# Patient Record
Sex: Female | Born: 1955 | Race: Black or African American | Hispanic: No | Marital: Married | Smoking: Never smoker
Health system: Southern US, Community
[De-identification: ages and names within clinical notes are randomized; demographics above are authoritative.]

## PROBLEM LIST (undated history)

## (undated) DIAGNOSIS — I10 Essential (primary) hypertension: Secondary | ICD-10-CM

## (undated) DIAGNOSIS — F329 Major depressive disorder, single episode, unspecified: Secondary | ICD-10-CM

## (undated) DIAGNOSIS — N8110 Cystocele, unspecified: Secondary | ICD-10-CM

## (undated) DIAGNOSIS — L309 Dermatitis, unspecified: Secondary | ICD-10-CM

## (undated) DIAGNOSIS — F32A Depression, unspecified: Secondary | ICD-10-CM

## (undated) DIAGNOSIS — G4700 Insomnia, unspecified: Secondary | ICD-10-CM

## (undated) DIAGNOSIS — N393 Stress incontinence (female) (male): Secondary | ICD-10-CM

## (undated) DIAGNOSIS — M1990 Unspecified osteoarthritis, unspecified site: Secondary | ICD-10-CM

## (undated) HISTORY — DX: Unspecified osteoarthritis, unspecified site: M19.90

## (undated) HISTORY — DX: Dermatitis, unspecified: L30.9

## (undated) HISTORY — DX: Stress incontinence (female) (male): N39.3

## (undated) HISTORY — DX: Major depressive disorder, single episode, unspecified: F32.9

## (undated) HISTORY — DX: Depression, unspecified: F32.A

## (undated) HISTORY — DX: Insomnia, unspecified: G47.00

## (undated) HISTORY — DX: Cystocele, unspecified: N81.10

---

## 2005-07-17 ENCOUNTER — Ambulatory Visit: Payer: Self-pay | Admitting: Family Medicine

## 2005-07-31 ENCOUNTER — Ambulatory Visit: Payer: Self-pay | Admitting: Family Medicine

## 2005-08-13 ENCOUNTER — Ambulatory Visit: Payer: Self-pay | Admitting: Family Medicine

## 2005-08-28 ENCOUNTER — Ambulatory Visit: Payer: Self-pay | Admitting: Family Medicine

## 2005-10-02 ENCOUNTER — Ambulatory Visit: Payer: Self-pay | Admitting: Family Medicine

## 2005-11-13 ENCOUNTER — Ambulatory Visit: Payer: Self-pay | Admitting: Family Medicine

## 2005-11-26 ENCOUNTER — Ambulatory Visit: Payer: Self-pay | Admitting: Family Medicine

## 2006-02-19 ENCOUNTER — Ambulatory Visit: Payer: Self-pay | Admitting: Family Medicine

## 2006-03-26 DIAGNOSIS — F339 Major depressive disorder, recurrent, unspecified: Secondary | ICD-10-CM | POA: Insufficient documentation

## 2006-03-26 DIAGNOSIS — K219 Gastro-esophageal reflux disease without esophagitis: Secondary | ICD-10-CM | POA: Insufficient documentation

## 2006-03-26 DIAGNOSIS — E669 Obesity, unspecified: Secondary | ICD-10-CM | POA: Insufficient documentation

## 2006-03-26 DIAGNOSIS — I10 Essential (primary) hypertension: Secondary | ICD-10-CM | POA: Insufficient documentation

## 2006-04-02 ENCOUNTER — Other Ambulatory Visit: Admission: RE | Admit: 2006-04-02 | Discharge: 2006-04-02 | Payer: Self-pay | Admitting: Family Medicine

## 2006-04-02 ENCOUNTER — Ambulatory Visit: Payer: Self-pay | Admitting: Family Medicine

## 2006-04-08 ENCOUNTER — Encounter: Payer: Self-pay | Admitting: Family Medicine

## 2006-05-16 ENCOUNTER — Encounter: Payer: Self-pay | Admitting: Family Medicine

## 2006-05-30 ENCOUNTER — Ambulatory Visit: Payer: Self-pay | Admitting: Family Medicine

## 2006-06-06 ENCOUNTER — Ambulatory Visit: Payer: Self-pay | Admitting: Family Medicine

## 2006-06-07 ENCOUNTER — Encounter: Payer: Self-pay | Admitting: Family Medicine

## 2006-06-17 ENCOUNTER — Telehealth: Payer: Self-pay | Admitting: Family Medicine

## 2006-06-24 ENCOUNTER — Telehealth (INDEPENDENT_AMBULATORY_CARE_PROVIDER_SITE_OTHER): Payer: Self-pay | Admitting: *Deleted

## 2006-07-16 ENCOUNTER — Ambulatory Visit: Payer: Self-pay | Admitting: Family Medicine

## 2006-07-16 DIAGNOSIS — R05 Cough: Secondary | ICD-10-CM

## 2006-07-16 DIAGNOSIS — R059 Cough, unspecified: Secondary | ICD-10-CM | POA: Insufficient documentation

## 2006-07-17 ENCOUNTER — Encounter: Payer: Self-pay | Admitting: Family Medicine

## 2006-07-22 ENCOUNTER — Encounter: Payer: Self-pay | Admitting: Family Medicine

## 2006-08-06 ENCOUNTER — Ambulatory Visit: Payer: Self-pay | Admitting: Family Medicine

## 2006-08-14 ENCOUNTER — Encounter: Payer: Self-pay | Admitting: Family Medicine

## 2006-08-20 ENCOUNTER — Telehealth: Payer: Self-pay | Admitting: Family Medicine

## 2006-10-22 ENCOUNTER — Ambulatory Visit: Payer: Self-pay | Admitting: Family Medicine

## 2006-10-22 DIAGNOSIS — M542 Cervicalgia: Secondary | ICD-10-CM | POA: Insufficient documentation

## 2006-10-22 DIAGNOSIS — G8929 Other chronic pain: Secondary | ICD-10-CM | POA: Insufficient documentation

## 2006-10-29 ENCOUNTER — Encounter: Payer: Self-pay | Admitting: Family Medicine

## 2006-10-29 LAB — CONVERTED CEMR LAB
AST: 15 units/L (ref 0–37)
Alkaline Phosphatase: 40 units/L (ref 39–117)
BUN: 16 mg/dL (ref 6–23)
Calcium: 9.2 mg/dL (ref 8.4–10.5)
Chloride: 105 meq/L (ref 96–112)
Creatinine, Ser: 0.82 mg/dL (ref 0.40–1.20)
HCT: 38.4 % (ref 36.0–46.0)
HDL: 37 mg/dL — ABNORMAL LOW (ref >39)
Hemoglobin: 12.5 g/dL (ref 12.0–15.0)
MCHC: 32.6 g/dL (ref 30.0–36.0)
RDW: 16 % — ABNORMAL HIGH (ref 11.5–14.0)
TSH: 0.714 microintl units/mL (ref 0.350–5.50)
Total CHOL/HDL Ratio: 4.4

## 2006-10-30 ENCOUNTER — Encounter: Payer: Self-pay | Admitting: Family Medicine

## 2007-07-11 ENCOUNTER — Ambulatory Visit: Payer: Self-pay | Admitting: Family Medicine

## 2007-07-11 DIAGNOSIS — J4530 Mild persistent asthma, uncomplicated: Secondary | ICD-10-CM | POA: Insufficient documentation

## 2007-08-22 ENCOUNTER — Ambulatory Visit: Payer: Self-pay | Admitting: Family Medicine

## 2007-08-27 ENCOUNTER — Telehealth: Payer: Self-pay | Admitting: Family Medicine

## 2007-11-14 ENCOUNTER — Ambulatory Visit: Payer: Self-pay | Admitting: Family Medicine

## 2007-11-28 ENCOUNTER — Encounter: Payer: Self-pay | Admitting: Family Medicine

## 2008-01-05 ENCOUNTER — Ambulatory Visit: Payer: Self-pay | Admitting: Family Medicine

## 2008-01-05 DIAGNOSIS — R109 Unspecified abdominal pain: Secondary | ICD-10-CM | POA: Insufficient documentation

## 2008-01-05 LAB — CONVERTED CEMR LAB
Blood in Urine, dipstick: NEGATIVE
Nitrite: NEGATIVE
Specific Gravity, Urine: 1.025

## 2008-01-06 ENCOUNTER — Encounter: Payer: Self-pay | Admitting: Family Medicine

## 2008-01-15 ENCOUNTER — Other Ambulatory Visit: Admission: RE | Admit: 2008-01-15 | Discharge: 2008-01-15 | Payer: Self-pay | Admitting: Family Medicine

## 2008-01-15 ENCOUNTER — Encounter: Payer: Self-pay | Admitting: Family Medicine

## 2008-01-15 ENCOUNTER — Ambulatory Visit: Payer: Self-pay | Admitting: Family Medicine

## 2008-01-16 ENCOUNTER — Encounter: Payer: Self-pay | Admitting: Family Medicine

## 2008-01-16 LAB — CONVERTED CEMR LAB

## 2008-01-21 ENCOUNTER — Ambulatory Visit: Payer: Self-pay | Admitting: Obstetrics & Gynecology

## 2008-01-26 ENCOUNTER — Encounter: Admission: RE | Admit: 2008-01-26 | Discharge: 2008-01-26 | Payer: Self-pay | Admitting: Obstetrics & Gynecology

## 2008-02-11 ENCOUNTER — Ambulatory Visit: Payer: Self-pay | Admitting: Obstetrics & Gynecology

## 2008-02-24 ENCOUNTER — Ambulatory Visit: Payer: Self-pay | Admitting: Family Medicine

## 2008-02-25 ENCOUNTER — Encounter: Payer: Self-pay | Admitting: Family Medicine

## 2008-02-25 LAB — CONVERTED CEMR LAB
Albumin: 4.1 g/dL (ref 3.5–5.2)
BUN: 17 mg/dL (ref 6–23)
CO2: 22 meq/L (ref 19–32)
Calcium: 9.1 mg/dL (ref 8.4–10.5)
Chloride: 104 meq/L (ref 96–112)
Creatinine, Ser: 0.95 mg/dL (ref 0.40–1.20)
Hemoglobin: 12.3 g/dL (ref 12.0–15.0)
Lymphocytes Relative: 36 % (ref 12–46)
Lymphs Abs: 2.5 10*3/uL (ref 0.7–4.0)
MCHC: 32.8 g/dL (ref 30.0–36.0)
Monocytes Absolute: 0.5 10*3/uL (ref 0.1–1.0)
Monocytes Relative: 7 % (ref 3–12)
Neutro Abs: 3.3 10*3/uL (ref 1.7–7.7)
RBC: 4.51 M/uL (ref 3.87–5.11)
TSH: 0.616 microintl units/mL (ref 0.350–4.50)
WBC: 6.8 10*3/uL (ref 4.0–10.5)

## 2008-05-10 ENCOUNTER — Encounter: Payer: Self-pay | Admitting: Family Medicine

## 2008-05-10 LAB — HM COLONOSCOPY

## 2008-08-09 ENCOUNTER — Ambulatory Visit: Payer: Self-pay | Admitting: Family Medicine

## 2008-08-09 DIAGNOSIS — J309 Allergic rhinitis, unspecified: Secondary | ICD-10-CM | POA: Insufficient documentation

## 2008-08-09 DIAGNOSIS — G43109 Migraine with aura, not intractable, without status migrainosus: Secondary | ICD-10-CM | POA: Insufficient documentation

## 2008-08-10 ENCOUNTER — Encounter: Payer: Self-pay | Admitting: Family Medicine

## 2008-08-11 LAB — CONVERTED CEMR LAB
Cholesterol, target level: 200 mg/dL
HDL goal, serum: 40 mg/dL
HDL: 36 mg/dL — ABNORMAL LOW (ref >39)
Triglycerides: 173 mg/dL — ABNORMAL HIGH (ref <150)

## 2008-10-18 ENCOUNTER — Ambulatory Visit: Payer: Self-pay | Admitting: Family Medicine

## 2008-11-10 ENCOUNTER — Encounter: Payer: Self-pay | Admitting: Family Medicine

## 2009-03-21 ENCOUNTER — Ambulatory Visit: Payer: Self-pay | Admitting: Family Medicine

## 2009-03-21 DIAGNOSIS — M2550 Pain in unspecified joint: Secondary | ICD-10-CM | POA: Insufficient documentation

## 2009-03-21 DIAGNOSIS — R319 Hematuria, unspecified: Secondary | ICD-10-CM | POA: Insufficient documentation

## 2009-03-21 DIAGNOSIS — N920 Excessive and frequent menstruation with regular cycle: Secondary | ICD-10-CM | POA: Insufficient documentation

## 2009-03-21 LAB — CONVERTED CEMR LAB
Bilirubin Urine: NEGATIVE
Glucose, Urine, Semiquant: NEGATIVE
Protein, U semiquant: NEGATIVE
Specific Gravity, Urine: 1.025

## 2009-03-22 ENCOUNTER — Encounter: Payer: Self-pay | Admitting: Family Medicine

## 2009-03-25 LAB — CONVERTED CEMR LAB
ALT: 12 units/L (ref 0–35)
AST: 14 units/L (ref 0–37)
Albumin: 3.9 g/dL (ref 3.5–5.2)
Alkaline Phosphatase: 48 units/L (ref 39–117)
BUN: 15 mg/dL (ref 6–23)
Calcium: 9.5 mg/dL (ref 8.4–10.5)
Chloride: 104 meq/L (ref 96–112)
HCT: 37.4 % (ref 36.0–46.0)
Platelets: 297 10*3/uL (ref 150–400)
Potassium: 4 meq/L (ref 3.5–5.3)
RDW: 15.8 % — ABNORMAL HIGH (ref 11.5–15.5)
Rhuematoid fact SerPl-aCnc: <20 intl units/mL (ref 0–20)
Sodium: 138 meq/L (ref 135–145)
Total Protein: 7.2 g/dL (ref 6.0–8.3)
WBC: 6.7 10*3/uL (ref 4.0–10.5)

## 2009-04-25 ENCOUNTER — Ambulatory Visit: Payer: Self-pay | Admitting: Family Medicine

## 2010-04-10 LAB — HM MAMMOGRAPHY

## 2010-04-18 ENCOUNTER — Encounter: Payer: Self-pay | Admitting: Family Medicine

## 2010-05-15 ENCOUNTER — Ambulatory Visit: Payer: Self-pay | Admitting: Family Medicine

## 2010-05-15 ENCOUNTER — Other Ambulatory Visit: Admission: RE | Admit: 2010-05-15 | Discharge: 2010-05-15 | Payer: Self-pay | Admitting: Family Medicine

## 2010-05-15 DIAGNOSIS — R5381 Other malaise: Secondary | ICD-10-CM | POA: Insufficient documentation

## 2010-05-15 DIAGNOSIS — R5383 Other fatigue: Secondary | ICD-10-CM

## 2010-05-15 DIAGNOSIS — E559 Vitamin D deficiency, unspecified: Secondary | ICD-10-CM | POA: Insufficient documentation

## 2010-05-15 LAB — CONVERTED CEMR LAB: Pap Smear: NORMAL

## 2010-05-16 LAB — CONVERTED CEMR LAB
AST: 17 units/L (ref 0–37)
Albumin: 4.3 g/dL (ref 3.5–5.2)
Alkaline Phosphatase: 46 units/L (ref 39–117)
BUN: 17 mg/dL (ref 6–23)
Creatinine, Ser: 0.87 mg/dL (ref 0.40–1.20)
Glucose, Bld: 83 mg/dL (ref 70–99)
HCT: 38 % (ref 36.0–46.0)
HDL: 38 mg/dL — ABNORMAL LOW (ref >39)
Hemoglobin: 12.4 g/dL (ref 12.0–15.0)
LDL Cholesterol: 120 mg/dL — ABNORMAL HIGH (ref 0–99)
MCHC: 32.6 g/dL (ref 30.0–36.0)
MCV: 83 fL (ref 78.0–100.0)
Potassium: 4 meq/L (ref 3.5–5.3)
RBC: 4.58 M/uL (ref 3.87–5.11)
RDW: 16.2 % — ABNORMAL HIGH (ref 11.5–15.5)
TSH: 0.514 microintl units/mL (ref 0.350–4.500)
Total Bilirubin: 0.5 mg/dL (ref 0.3–1.2)
Total CHOL/HDL Ratio: 4.8
Triglycerides: 117 mg/dL (ref <150)

## 2010-05-18 LAB — CONVERTED CEMR LAB: Pap Smear: NEGATIVE

## 2010-06-05 ENCOUNTER — Ambulatory Visit: Payer: Self-pay | Admitting: Family Medicine

## 2010-06-05 DIAGNOSIS — E538 Deficiency of other specified B group vitamins: Secondary | ICD-10-CM | POA: Insufficient documentation

## 2010-07-03 ENCOUNTER — Ambulatory Visit
Admission: RE | Admit: 2010-07-03 | Discharge: 2010-07-03 | Payer: Self-pay | Source: Home / Self Care | Attending: Family Medicine | Admitting: Family Medicine

## 2010-07-18 NOTE — Miscellaneous (Signed)
Summary: mammogram  Clinical Lists Changes  Observations: Added new observation of MAMMO DUE: 04/2011 (04/18/2010 13:09) Added new observation of MAMMRECACT: Screening mammogram in 1 year.    (04/10/2010 13:10) Added new observation of MAMMOGRAM: Location: [facility].   No specific mammographic evidence of malignancy.  No significant changes compared to previous study.  Assessment: BIRADS 1.  (04/10/2010 13:10)      Mammogram  Procedure date:  04/10/2010  Findings:      Location: [facility].   No specific mammographic evidence of malignancy.  No significant changes compared to previous study.  Assessment: BIRADS 1.   Comments:      Screening mammogram in 1 year.     Procedures Next Due Date:    Mammogram: 04/2011   Mammogram  Procedure date:  04/10/2010  Findings:      Location: [facility].   No specific mammographic evidence of malignancy.  No significant changes compared to previous study.  Assessment: BIRADS 1.   Comments:      Screening mammogram in 1 year.     Procedures Next Due Date:    Mammogram: 04/2011

## 2010-07-18 NOTE — Assessment & Plan Note (Signed)
Summary: CPE with pap   Vital Signs:  Patient profile:   55 year old female Height:      62.5 inches Weight:      214 pounds BMI:     38.66 O2 Sat:      96 % on Room air Pulse rate:   59 / minute BP sitting:   127 / 79  (left arm) Cuff size:   large  Vitals Entered By: Payton Spark CMA (May 15, 2010 1:17 PM)  O2 Flow:  Room air CC: CPE w/ pap   Primary Care Provider:  Seymour Bars D.O.  CC:  CPE w/ pap.  History of Present Illness: 55 yo AAF presents for CPE with pap smear.  It has been 2 yrs since her last pap. She is widowed and not in any relationships.  Continues to have regular menses.  She is G2P2.  She has felt more tired lately.  Had a colonoscopy in 09 and was told to f/u in 8 yrs.  Her tetanus vaccine is UTD.  She is due for a flu shot and fasting labs.  Her mammogram is UTD.  Denies fam hx of premature heart dz.  Denies CP or DOE.     Current Medications (verified): 1)  Advair Diskus 250-50 Mcg/dose Aepb (Fluticasone-Salmeterol) 2)  Proair Hfa 108 (90 Base) Mcg/act Aers (Albuterol Sulfate) .... 2 Puffs Inhaled Every 4 Hours As Needed For Shortness of Breath 3)  Metoprolol-Hydrochlorothiazide 50-25 Mg Tabs (Metoprolol-Hydrochlorothiazide) .... Take 1 Tablet By Mouth Once A Day 4)  Protonix 40 Mg  Tbec (Pantoprazole Sodium) .Marland Kitchen.. 1 Tab By Mouth Daily  Allergies (verified): 1)  Ace Inhibitors  Past History:  Past Medical History: Reviewed history from 10/18/2008 and no changes required. constipation  insomnia eczema  depression neck arthritis mild persistent asthma stress urinary incontinence Bladder prolapse  pap - [place]  Past Surgical History: Reviewed history from 03/26/2006 and no changes required. _  Family History: Reviewed history from 05/16/2006 and no changes required. 2 brothers healthy  2 sisters, HTN  aunt breast cancer in late 75s  father alive HTN  mother alive, RA  Social History: Reviewed history from 03/26/2006 and  no changes required. Widowed to [place] 2007.  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  Nonsmoker.  Denies ETOH.  Joining a gym.  Review of Systems       The patient complains of weight gain.  The patient denies anorexia, fever, weight loss, vision loss, decreased hearing, hoarseness, chest pain, syncope, dyspnea on exertion, peripheral edema, prolonged cough, headaches, hemoptysis, abdominal pain, melena, hematochezia, severe indigestion/heartburn, hematuria, incontinence, genital sores, muscle weakness, suspicious skin lesions, transient blindness, difficulty walking, depression, unusual weight change, abnormal bleeding, enlarged lymph nodes, angioedema, breast masses, and testicular masses.    Physical Exam  General:  alert, well-developed, well-nourished, and well-hydrated.  obese Head:  normocephalic and atraumatic.   Eyes:  PERRLA, allergic shiners present Ears:  no external deformities.   Nose:  no nasal discharge.   Mouth:  pharynx pink and moist and fair dentition.   Neck:  no masses.   Breasts:  No mass, nodules, thickening, tenderness, bulging, retraction, inflamation, nipple discharge or skin changes noted.   Lungs:  Normal respiratory effort, chest expands symmetrically. Lungs are clear to auscultation, no crackles or wheezes. Heart:  Normal rate and regular rhythm. S1 and S2 normal without gallop, murmur, click, rub or other extra sounds. Abdomen:  soft, non-tender, normal  bowel sounds, no distention, no masses, no guarding, no hepatomegaly, and no splenomegaly.   Genitalia:  Pelvic Exam:        External: normal female genitalia without lesions or masses        Vagina: normal without lesions or masses        Cervix: normal without lesions or masses        Adnexa: normal bimanual exam without masses or fullness        Uterus: normal by palpation        Pap smear: performed Pulses:  2+ radial and pedal pulses Extremities:  trace LE edema Skin:   color normal.   Cervical Nodes:  No lymphadenopathy noted Psych:  good eye contact, not anxious appearing, and not depressed appearing.     Impression & Recommendations:  Problem # 1:  ROUTINE GYNECOLOGICAL EXAMINATION (ICD-V72.31) Keeping healthy checklist for women reviewed. BP at goal. BMI 38 c/w class II obesity.  Will work on [employer], regular exercise, wt loss. Mammogram doen 03-2010. Tdap done 2007.  Colonoscopy done 2009. Update fasting labs today. Flu shot today. Annual eye exam.  Add Singulair for allergic asthma.  Complete Medication List: 1)  Advair Diskus 250-50 Mcg/dose Aepb (Fluticasone-salmeterol) .Marland Kitchen.. 1 puff bid 2)  Proair Hfa 108 (90 Base) Mcg/act Aers (Albuterol sulfate) .... 2 puffs inhaled every 4 hours as needed for shortness of breath 3)  Metoprolol-hydrochlorothiazide 50-25 Mg Tabs (Metoprolol-hydrochlorothiazide) .... Take 1 tablet by mouth once a day 4)  Famotidine 40 Mg Tabs (Famotidine) .Marland Kitchen.. 1 tab by mouth two times a day for acid reflux 5)  Singulair 10 Mg Tabs (Montelukast sodium) .Marland Kitchen.. 1 tab by mouth qpm  Other Orders: T-CBC No Diff ([id number]) T-Comprehensive Metabolic Panel (09323-55732) T-Lipid Profile (20254-27062) T-TSH [id number]) T-Vitamin B12 [phone]) T-Vitamin D (25-Hydroxy) [phone]) Admin 1st Vaccine (03500) Flu Vaccine 50yrs + (93818)  Patient Instructions: 1)  Update fasting labs today. 2)  Will call you w/ results tomorrow. 3)  Add Singulair 10 mg in the evenings for asthma and allergies. 4)  Flu shot today. 5)  Return for asthma f/u in 3 mos. Prescriptions: SINGULAIR 10 MG TABS (MONTELUKAST SODIUM) 1 tab by mouth qPM  #30 x 3   Entered and Authorized by:   Seymour Bars DO   Signed by:   Seymour Bars DO on 05/15/2010   Method used:   Electronically to        [facility] [phone]* (retail)       [address] [place], [place]  [address]       Ph: [phone]       Fax: [phone]   RxID:    [id number] FAMOTIDINE 40 MG TABS (FAMOTIDINE) 1 tab by mouth two times a day for acid reflux  #60 x 3   Entered and Authorized by:   Seymour Bars DO   Signed by:   Seymour Bars DO on 05/15/2010   Method used:   Electronically to        [facility] [phone]* (retail)       [address] [place], [place]  [address]       Ph: [phone]       Fax: [phone]   RxID:   [id number]    Orders Added: 1)  T-CBC No Diff [[id number]] 2)  T-Comprehensive Metabolic Panel [80053-22900] 3)  T-Lipid Profile [80061-22930] 4)  T-TSH [16109-60454] 5)  T-Vitamin B12 [82607-23330] 6)  T-Vitamin D (25-Hydroxy) [phone] 7)  Admin 1st Vaccine [90471] 8)  Flu Vaccine 93yrs + [90658] 9)  Est. Patient age 71-64 [65]  Flu Vaccine Consent Questions     Do you have a history of severe allergic reactions to this vaccine? no    Any prior history of allergic reactions to egg and/or gelatin? no    Do you have a sensitivity to the preservative Thimersol? no    Do you have a past history of Guillan-Barre Syndrome? no    Do you currently have an acute febrile illness? no    Have you ever had a severe reaction to latex? no    Vaccine information given and explained to patient? yes    Are you currently pregnant? no    Lot Number:[id number]   Exp Date:12/16/2010   Site Given  Left Deltoid IManagement-CCC]      .lbflu

## 2010-07-20 NOTE — Assessment & Plan Note (Signed)
Summary: B-12 SHOT-VEW  Nurse Visit   Vitals Entered By: Payton Spark CMA (June 05, 2010 1:18 PM)  Allergies: 1)  Ace Inhibitors  Medication Administration  Injection # 1:    Medication: Vit B12 1000 mcg    Diagnosis: UNSPECIFIED VITAMIN D DEFICIENCY (ICD-268.9)    Route: IM    Site: R deltoid    Exp Date: 01/2012    Lot #: [id number]    Patient tolerated injection without complications    Given by: Payton Spark CMA (June 05, 2010 1:19 PM)  Orders Added: 1)  Vit B12 1000 mcg [J3420] 2)  Admin of Therapeutic Inj  intramuscular or subcutaneous [96372]   Medication Administration  Injection # 1:    Medication: Vit B12 1000 mcg    Diagnosis: UNSPECIFIED VITAMIN D DEFICIENCY (ICD-268.9)    Route: IM    Site: R deltoid    Exp Date: 01/2012    Lot #: [id number]    Patient tolerated injection without complications    Given by: Payton Spark CMA (June 05, 2010 1:19 PM)  Orders Added: 1)  Vit B12 1000 mcg [J3420] 2)  Admin of Therapeutic Inj  intramuscular or subcutaneous [29562]

## 2010-07-20 NOTE — Assessment & Plan Note (Signed)
Summary: B12 injection   Nurse Visit   Vitals Entered By: Payton Spark CMA (July 03, 2010 1:15 PM)  Allergies: 1)  Ace Inhibitors  Medication Administration  Injection # 1:    Medication: Vit B12 1000 mcg    Diagnosis: B12 DEFICIENCY (ICD-266.2)    Route: IM    Site: L deltoid    Exp Date: 03/2012    Lot #: [id number]    Patient tolerated injection without complications    Given by: Payton Spark CMA (July 03, 2010 1:15 PM)  Orders Added: 1)  Vit B12 1000 mcg [J3420] 2)  Admin of Therapeutic Inj  intramuscular or subcutaneous [96372]   Medication Administration  Injection # 1:    Medication: Vit B12 1000 mcg    Diagnosis: B12 DEFICIENCY (ICD-266.2)    Route: IM    Site: L deltoid    Exp Date: 03/2012    Lot #: [id number]    Patient tolerated injection without complications    Given by: Payton Spark CMA (July 03, 2010 1:15 PM)  Orders Added: 1)  Vit B12 1000 mcg [J3420] 2)  Admin of Therapeutic Inj  intramuscular or subcutaneous [16109]

## 2010-08-14 ENCOUNTER — Encounter: Payer: Self-pay | Admitting: Family Medicine

## 2010-08-14 ENCOUNTER — Ambulatory Visit (INDEPENDENT_AMBULATORY_CARE_PROVIDER_SITE_OTHER): Payer: BC Managed Care – PPO | Admitting: Family Medicine

## 2010-08-14 DIAGNOSIS — J45909 Unspecified asthma, uncomplicated: Secondary | ICD-10-CM

## 2010-08-14 DIAGNOSIS — E538 Deficiency of other specified B group vitamins: Secondary | ICD-10-CM

## 2010-08-14 DIAGNOSIS — E559 Vitamin D deficiency, unspecified: Secondary | ICD-10-CM

## 2010-08-14 DIAGNOSIS — J309 Allergic rhinitis, unspecified: Secondary | ICD-10-CM

## 2010-08-15 ENCOUNTER — Encounter: Payer: Self-pay | Admitting: Family Medicine

## 2010-08-15 LAB — CONVERTED CEMR LAB
Vit D, 25-Hydroxy: 34 ng/mL (ref 30–89)
Vitamin B-12: >2000 pg/mL — ABNORMAL HIGH (ref 211–911)

## 2010-08-24 NOTE — Assessment & Plan Note (Signed)
Summary: f/u asthma   Vital Signs:  Patient profile:   55 year old female Height:      62.5 inches Weight:      207 pounds BMI:     37.39 O2 Sat:      98 % on Room air Pulse rate:   56 / minute BP sitting:   131 / 77  (left arm) Cuff size:   large  Vitals Entered By: Payton Spark CMA (August 14, 2010 3:23 PM)  O2 Flow:  Room air  Serial Vital Signs/Assessments:  Comments: 3:23 PM PEAK FLOW [address] By: Payton Spark CMA   CC: F/U asthma   Primary Care Provider:  Seymour Bars D.O.  CC:  F/U asthma.  History of Present Illness: 55 yo AAF presents for f/u asthma.  She reports having the FLU last wk but is feeling much better now.  No longer coughing.  Denies chest tightness or SOB.  She had a flu shot this year.  She did have to use her proair a little more often.  She did lose 7 lbs from November. She is using the Advair 250/50 two times a day.  She is not taking the Singulair because it caused her to get HA.  Her allergies flare in the Spring.  Allegra worked well for her in the past.       Allergies: 1)  Ace Inhibitors  Past History:  Past Medical History: Reviewed history from 10/18/2008 and no changes required. constipation  insomnia eczema  depression neck arthritis mild persistent asthma stress urinary incontinence Bladder prolapse  pap - [place]  Family History: Reviewed history from 05/16/2006 and no changes required. 2 brothers healthy  2 sisters, HTN  aunt breast cancer in late 71s  father alive HTN  mother alive, RA  Social History: Reviewed history from 03/26/2006 and no changes required. Widowed to [place] 2007.  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  Nonsmoker.  Denies ETOH.  Joining a gym.  Review of Systems      See HPI  Physical Exam  General:  alert, well-developed, well-nourished, and well-hydrated.  obese Head:  normocephalic and atraumatic.   Eyes:  allergic shiners present;  conjunctiva clear Nose:  boggy turbinates, no rhinorrhea Mouth:  o/p pink and moist; fair dentition.   Neck:  no masses.   Lungs:  Normal respiratory effort, chest expands symmetrically. Lungs are clear to auscultation, no crackles or wheezes. Heart:  Normal rate and regular rhythm. S1 and S2 normal without gallop, murmur, click, rub or other extra sounds. Skin:  color normal.   Cervical Nodes:  No lymphadenopathy noted Psych:  good eye contact, not anxious appearing, and not depressed appearing.     Impression & Recommendations:  Problem # 1:  ASTHMA, PERSISTENT, MODERATE (ICD-493.90) Well controlled.  PFs in green zone and rarely needing ProAir.  Had HAs on Singulair.  OK to stay on Advair two times a day and ProAir as needed.  RTC in 4 mos. The following medications were removed from the medication list:    Singulair 10 Mg Tabs (Montelukast sodium) .Marland Kitchen... 1 tab by mouth qpm Her updated medication list for this problem includes:    Advair Diskus 250-50 Mcg/dose Aepb (Fluticasone-salmeterol) .Marland Kitchen... 1 puff bid    Proair Hfa 108 (90 Base) Mcg/act Aers (Albuterol sulfate) .Marland Kitchen... 2 puffs inhaled every 4 hours as needed for shortness of breath  Problem # 2:  ALLERGIC RHINITIS (ICD-477.9) She  did well on allegra so RX given.  Will try to stay ahead of spring allergy.   Her updated medication list for this problem includes:    Fexofenadine Hcl 180 Mg Tabs (Fexofenadine hcl) .Marland Kitchen... 1 tab by mouth once daily  Problem # 3:  B12 DEFICIENCY (ICD-266.2) Will recheck B12 level since she has been on injections x 3 mos. Orders: Vit B12 1000 mcg (J3420) Admin of Therapeutic Inj  intramuscular or subcutaneous (16109) T-Vitamin B12 (60454-09811)  Problem # 4:  UNSPECIFIED VITAMIN D DEFICIENCY (ICD-268.9) Recheck Vit D level after finishing 3 mos of RX wkly Vit D. Orders: T-Vitamin D (25-Hydroxy) [phone])  Problem # 5:  HYPERTENSION, BENIGN SYSTEMIC (ICD-401.1) BP at goal on current meds,  continue. Her updated medication list for this problem includes:    Metoprolol-hydrochlorothiazide 50-25 Mg Tabs (Metoprolol-hydrochlorothiazide) .Marland Kitchen... Take 1 tablet by mouth once a day  BP today: 131/77 Prior BP: 127/79 (05/15/2010)  Prior 10 Yr Risk Heart Disease: 9 % (08/11/2008)  Labs Reviewed: K+: 4.0 (05/15/2010) Creat: : 0.87 (05/15/2010)   Chol: 181 (05/15/2010)   HDL: 38 (05/15/2010)   LDL: 120 (05/15/2010)   TG: 117 (05/15/2010)  Complete Medication List: 1)  Advair Diskus 250-50 Mcg/dose Aepb (Fluticasone-salmeterol) .Marland Kitchen.. 1 puff bid 2)  Proair Hfa 108 (90 Base) Mcg/act Aers (Albuterol sulfate) .... 2 puffs inhaled every 4 hours as needed for shortness of breath 3)  Metoprolol-hydrochlorothiazide 50-25 Mg Tabs (Metoprolol-hydrochlorothiazide) .... Take 1 tablet by mouth once a day 4)  Famotidine 40 Mg Tabs (Famotidine) .Marland Kitchen.. 1 tab by mouth two times a day for acid reflux 5)  Vitamin D 50,000 Iu Capsule  .Marland Kitchen.. 1 capsule by mouth once a wk 6)  Fexofenadine Hcl 180 Mg Tabs (Fexofenadine hcl) .Marland Kitchen.. 1 tab by mouth once daily  Patient Instructions: 1)  Meds RFd. 2)  Asthma doing well! 3)  Update vitamin levels today. 4)  Will call you w/ results tomorrow. 5)  REturn for f/u in 4 mos. Prescriptions: FAMOTIDINE 40 MG TABS (FAMOTIDINE) 1 tab by mouth two times a day for acid reflux  #180 x 1   Entered and Authorized by:   Seymour Bars DO   Signed by:   Seymour Bars DO on 08/14/2010   Method used:   Faxed to ...       [facility] (mail-order)             , [place]         Ph: [phone]       Fax: [phone]   RxID:   [id number] METOPROLOL-HYDROCHLOROTHIAZIDE 50-25 MG TABS (METOPROLOL-HYDROCHLOROTHIAZIDE) Take 1 tablet by mouth once a day  #90 x 1   Entered and Authorized by:   Seymour Bars DO   Signed by:   Seymour Bars DO on 08/14/2010   Method used:   Faxed to ...       [facility] (mail-order)             , [place]         Ph: [phone]       Fax: [phone]   RxID:    [id number] PROAIR HFA 108 (90 BASE) MCG/ACT AERS (ALBUTEROL SULFATE) 2 puffs inhaled every 4 hours as needed for shortness of breath  #2 x 1   Entered and Authorized by:   Seymour Bars DO   Signed by:   Seymour Bars DO on 08/14/2010   Method used:   Faxed to ...       [facility] (  mail-order)             , [place]         Ph: [phone]       Fax: [phone]   RxID:   [id number] ADVAIR DISKUS 250-50 MCG/DOSE AEPB (FLUTICASONE-SALMETEROL) 1 puff bid  #3 diskus x 3   Entered and Authorized by:   Seymour Bars DO   Signed by:   Seymour Bars DO on 08/14/2010   Method used:   Faxed to ...       [facility] (mail-order)             , [place]         Ph: [phone]       Fax: [phone]   RxID:   [id number] FEXOFENADINE HCL 180 MG TABS (FEXOFENADINE HCL) 1 tab by mouth once daily  #90 x 1   Entered and Authorized by:   Seymour Bars DO   Signed by:   Seymour Bars DO on 08/14/2010   Method used:   Faxed to ...       [facility] (mail-order)             , [place]         Ph: [phone]       Fax: [phone]   RxID:   [id number]    Medication Administration  Injection # 1:    Medication: Vit B12 1000 mcg    Diagnosis: B12 DEFICIENCY (ICD-266.2)    Route: IM    Site: L deltoid    Patient tolerated injection without complications    Given by: Payton Spark CMA (August 14, 2010 3:24 PM)  Orders Added: 1)  Vit B12 1000 mcg [J3420] 2)  Admin of Therapeutic Inj  intramuscular or subcutaneous [96372] 3)  T-Vitamin D (25-Hydroxy) [37628-31517] 4)  T-Vitamin B12 [82607-23330] 5)  Est. Patient Level IV [61607]     Medication Administration  Injection # 1:    Medication: Vit B12 1000 mcg    Diagnosis: B12 DEFICIENCY (ICD-266.2)    Route: IM    Site: L deltoid    Patient tolerated injection without complications    Given by: Payton Spark CMA (August 14, 2010 3:24 PM)  Orders Added: 1)  Vit B12 1000 mcg [J3420] 2)  Admin of Therapeutic Inj  intramuscular or subcutaneous  [96372] 3)  T-Vitamin D (25-Hydroxy) [37106-26948] 4)  T-Vitamin B12 [82607-23330] 5)  Est. Patient Level IV [54627]

## 2010-09-28 ENCOUNTER — Encounter: Payer: Self-pay | Admitting: Family Medicine

## 2010-10-02 ENCOUNTER — Ambulatory Visit: Payer: BC Managed Care – PPO | Admitting: Family Medicine

## 2010-10-18 ENCOUNTER — Ambulatory Visit (INDEPENDENT_AMBULATORY_CARE_PROVIDER_SITE_OTHER): Payer: BC Managed Care – PPO | Admitting: Family Medicine

## 2010-10-18 ENCOUNTER — Encounter: Payer: Self-pay | Admitting: Family Medicine

## 2010-10-18 DIAGNOSIS — G479 Sleep disorder, unspecified: Secondary | ICD-10-CM

## 2010-10-18 DIAGNOSIS — F339 Major depressive disorder, recurrent, unspecified: Secondary | ICD-10-CM

## 2010-10-18 MED ORDER — SERTRALINE HCL 50 MG PO TABS: ORAL_TABLET | ORAL | Status: DC

## 2010-10-18 MED ORDER — ZOLPIDEM TARTRATE 5 MG PO TABS: 5.0000 mg | ORAL_TABLET | Freq: Every evening | ORAL | Status: DC | PRN

## 2010-10-18 NOTE — Assessment & Plan Note (Signed)
Sleep problems x 4 mos with change in job and hours.  Complicated by stress, anxiety, depression.  Will treat mood d/o and add Zolpidem 5 mg qhs prn sleep to reset sleep schedule.

## 2010-10-18 NOTE — Patient Instructions (Signed)
Start Sertaline 25 mg once daily x 1 wk then go up to 50 mg daily - take in the morning.  Use Zolpidem 5 mg at bedtime as needed for sleep.  Allow 8 hrs to sleep.  Call if any problems.  Return for f/u in 6 wks.

## 2010-10-18 NOTE — Assessment & Plan Note (Signed)
Mood worsened by new stressors at work, likely contributing to her sleep issues.  She requests to go back on antidepressants.  She is not a threat to herself or others.  Will start Zoloft 25 mg/ day for the first wk then go up to 50 mg/ day.  Call if any problems.  rtc for f/u in 6 wks.

## 2010-10-18 NOTE — Progress Notes (Signed)
  Subjective:    Patient ID: Carolyn [name], female    DOB: 06-Jun-1956, 55 y.o.   MRN: [id number]  HPI  55 yo AAFpresents for 3-4 mos of sleep problems.  Occuring every night.  She tries to go to bed at 9:30 pm but cannot fall asleep.  She did start a new job that is stressing her out.  Unisom OTC is not helping.  Tired during the day.  Has had problems with sleep on and off thru the years.  Used to take RX sleep meds in the past.  She drinks 1 cup of coffee everyday.    BP 118/80  Pulse 61  Ht 5\' 1"  (1.549 m)  Wt 206 lb (93.441 kg)  BMI 38.92 kg/m2  SpO2 98%  LMP 09/30/2010    Review of Systems  Constitutional: Positive for fatigue.  Respiratory: Negative for shortness of breath.   Cardiovascular: Negative for chest pain and palpitations.  Neurological: Negative for headaches.  Psychiatric/Behavioral: Positive for sleep disturbance and dysphoric mood. Negative for suicidal ideas and self-injury. The patient is nervous/anxious.        Objective:   Physical Exam  Constitutional: She appears well-developed and well-nourished. No distress.       obese  HENT:  Head: Normocephalic and atraumatic.  Neck: No thyromegaly present.  Cardiovascular: Normal rate, regular rhythm and normal heart sounds.   Pulmonary/Chest: Effort normal and breath sounds normal. No respiratory distress. She has no wheezes.  Musculoskeletal: She exhibits no edema.  Lymphadenopathy:    She has no cervical adenopathy.  Skin: Skin is warm and dry.  Psychiatric: Her speech is normal and behavior is normal. Judgment and thought content normal. Her mood appears not anxious. Her affect is blunt. Cognition and memory are normal. She exhibits a depressed mood.          Assessment & Plan:

## 2010-10-31 NOTE — Assessment & Plan Note (Signed)
NAME:  Carolyn [name], Carolyn [name]                ACCOUNT NO.:  [id number]   MEDICAL RECORD NO.:  [id number]          PATIENT TYPE:  POB   LOCATION:  [facility] [place]         FACILITY:  [facility]   PHYSICIAN:  Elsie Lincoln, MD      DATE OF BIRTH:  Mar 14, 1956   DATE OF SERVICE:  01/21/2008                                  CLINIC NOTE   HISTORY OF PRESENT ILLNESS:  The patient is a 55 year old G2, P2  menstruating female whose period was 7 days ago, presents for vaginal  and suprapubic pain for about a year.  She has been recently treated for  bacterial vaginosis by her primary care Daiya Tamer.  She is not sexually  active.  She denies any burning or itching of her vulva.  She denies any  dysuria, however she does have suprapubic pain.  She has chronic  constipation that is not well controlled at this time but is not taking  anything for it.  The patient has felt like there is something falling  out of her vagina at times.  She does have loss of urine with cough but  does not wear a pad, and occasionally she has some urge incontinence  symptoms when she cannot make it to the bathroom before starting to  void.  She denies any abnormal uterine bleeding.   PAST MEDICAL HISTORY:  Asthma, hypertension, and hemorrhoids.   PAST SURGICAL HISTORY:  None.   OBSTETRICAL HISTORY:  NSVD X2.   FAMILY HISTORY:  Positive for aunt with breast cancer.   ALLERGIES:  ACE INHIBITORS cause a cough, therefore the patient has the  side effects of ACE INHIBITOR.   MEDICATIONS:  Advair, ProAir, metoprolol, trazodone, Protonix, Colace,  and diclofenac sodium.   REVIEW OF SYSTEMS:  As above.   PHYSICAL EXAMINATION:  VITALS:  Pulse 52, blood pressure 114/73, weight  205, and height 62-1/2 inches.  GENERAL:  Well nourished, well developed, in no apparent distress.  HEENT:  Normocephalic and atraumatic.  CHEST:  Normal movement.  ABDOMEN:  Soft, nontender, and nondistended.  No rebound or guarding.  Suprapubic pain only  felt bimanually.  PELVIC:  Genitalia, Tanner V.  Vagina pink, normal rugae.  Moderate pain  over the bladder.  Mild cystocele.  Grade 2 uterine prolapse.  Cervix  closed.  Uterus and adnexa difficult to palpate secondary to habitus.  RECTOVAGINAL:  Positive stool involved.  No masses.  EXTREMITIES:  No edema.   ASSESSMENT AND PLAN:  A 55 year old female with 1-year pelvic vaginal  pain.  1. Urinalysis and urine culture.  2. Transvaginal ultrasound.  3. Constipation can be treated with Colace twice a day and MiraLax.  4. Referral to Dr. Perley Jain, who is her urologist.  The patient is to      be evaluated for interstitial cystitis and mild urinary      incontinence symptoms.  I do not believe her second-degree uterine      prolapse is causing her pelvic pressure.  I think her bladder is      much more tender than any other part of her pelvic anatomy.  5. Return to clinic after the Urology visit, ultrasound, and urine  culture.           ______________________________  Elsie Lincoln, MD     KL/MEDQ  D:  01/21/2008  T:  01/22/2008  Job:  [id number]

## 2010-10-31 NOTE — Assessment & Plan Note (Signed)
NAME:  [name], Carolyn                ACCOUNT NO.:  [id number]   MEDICAL RECORD NO.:  [id number]          PATIENT TYPE:  POB   LOCATION:  [facility] [place]         FACILITY:  [facility]   PHYSICIAN:  Elsie Lincoln, MD      DATE OF BIRTH:  [birth year]   DATE OF SERVICE:                                  CLINIC NOTE   The patient is a 55 year old female who comes here for followup of  pelvic pain.  On her initial visit, we thought that it was more  localized to her bladder and not her uterus or ovaries.  She also had  chronic constipation.  She was started on Colace and MiraLax and is  doing much better.  The patient was better with her bowel movement, but  her pain is still suprapubic.  The urine culture revealed no growth.  A  transvaginal ultrasound showed normal-size uterus and normal thickness  of endometrium.  The ovaries measured completely normal with no cysts.  The patient is still menstruating, so a lining of 5.4 mm is normal.  The  patient did see Dr. 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  We discussed what this tests were and what hopefully to find.  At this point since her pain is not GYN in nature, the patient to come  back here on a p.r.n. basis.  I did review her health care maintenance.  Her Pap smear is up-to-date and her mammogram is up-to-date, these were  managed by Dr. Cathey Endow.  She also knows that she just to get a  colonoscopy.  Dr. Cathey Endow has ordered one.  The patient will return to [place]  as necessary           ______________________________  Elsie Lincoln, MD     KL/MEDQ  D:  02/11/2008  T:  02/11/2008  Job:  [id number]   cc:   Glennis Brink

## 2010-11-15 ENCOUNTER — Telehealth: Payer: Self-pay | Admitting: Family Medicine

## 2010-11-15 DIAGNOSIS — N926 Irregular menstruation, unspecified: Secondary | ICD-10-CM

## 2010-11-15 NOTE — Telephone Encounter (Signed)
Pt notifed to cut the zoloft in half for next 3 days, then stop it.  Follow up in  he office next Monday.  Pt voiced understanding. Jarvis Newcomer, LPN [facility]

## 2010-11-15 NOTE — Telephone Encounter (Signed)
Abnormal bleeding is not common from zoloft but it can disrupt platelet function, so we do need to get a CBC on her.  Have her cut her zoloft in half for 3 days then stop it.    Set up OV with me on Monday for follow up.

## 2010-11-15 NOTE — Telephone Encounter (Signed)
Pt called and has been bleeding on her menstrual cycle since she started the zoloft medication.  It has been 14 days bleeding, and she is requesting we change her to a diff medication. FYI regarding the ambien med--helps sometimes but not all the time. Plan:  Route to Dr. Cathey Endow for advice. Jarvis Newcomer, LPN [facility]

## 2010-11-20 ENCOUNTER — Encounter: Payer: Self-pay | Admitting: Family Medicine

## 2010-11-20 ENCOUNTER — Ambulatory Visit (INDEPENDENT_AMBULATORY_CARE_PROVIDER_SITE_OTHER): Payer: BC Managed Care – PPO | Admitting: Family Medicine

## 2010-11-20 ENCOUNTER — Telehealth: Payer: Self-pay | Admitting: Family Medicine

## 2010-11-20 VITALS — BP 123/84 | HR 51 | Ht 62.0 in | Wt 204.0 lb

## 2010-11-20 DIAGNOSIS — F339 Major depressive disorder, recurrent, unspecified: Secondary | ICD-10-CM

## 2010-11-20 DIAGNOSIS — N926 Irregular menstruation, unspecified: Secondary | ICD-10-CM

## 2010-11-20 DIAGNOSIS — G479 Sleep disorder, unspecified: Secondary | ICD-10-CM

## 2010-11-20 LAB — CBC WITH DIFFERENTIAL/PLATELET
HCT: 35.5 % — ABNORMAL LOW (ref 36.0–46.0)
MCHC: 33.8 g/dL (ref 30.0–36.0)
MCV: 83.8 fL (ref 78.0–100.0)
Monocytes Absolute: 0.4 10*3/uL (ref 0.1–1.0)
Platelets: 272 10*3/uL (ref 150–400)
RDW: 15.1 % (ref 11.5–15.5)

## 2010-11-20 MED ORDER — ESZOPICLONE 3 MG PO TABS: 3.0000 mg | ORAL_TABLET | Freq: Every evening | ORAL | Status: DC | PRN

## 2010-11-20 MED ORDER — BUPROPION HCL ER (XL) 150 MG PO TB24: 150.0000 mg | ORAL_TABLET | ORAL | Status: DC

## 2010-11-20 NOTE — Progress Notes (Signed)
  Subjective:    Patient ID: Carolyn [name], female    DOB: Jan 03, 1956, 55 y.o.   MRN: [id number]  HPI 55 yo AAF presents for f/u depression.  She had done well on Zoloft but noticed that she had an irregular period for the first time.  She had mild bleeding for 3 wks in a row.  I asked her to come in to have a CBC checked today.  She is still not sleeping well on Ambien.  She is not seeing a Veterinary surgeon.  She is back to work.    BP 123/84  Pulse 51  Ht 5\' 2"  (1.575 m)  Wt 204 lb (92.534 kg)  BMI 37.31 kg/m2  SpO2 98%  LMP 10/30/2010  Review of Systems  Constitutional: Negative for fatigue and unexpected weight change.  Eyes: Negative for visual disturbance.  Respiratory: Negative for shortness of breath.   Cardiovascular: Negative for chest pain, palpitations and leg swelling.  Genitourinary: Positive for vaginal bleeding.  Neurological: Negative for headaches.       Objective:   Physical Exam  Constitutional: She appears well-developed and well-nourished. No distress.       obese  Neck: Neck supple.  Cardiovascular: Normal rate, regular rhythm and normal heart sounds.   Pulmonary/Chest: Effort normal and breath sounds normal. No respiratory distress. She has no wheezes.  Musculoskeletal: She exhibits no edema.  Lymphadenopathy:    She has no cervical adenopathy.  Skin: Skin is warm and dry.  Psychiatric: She has a normal mood and affect.          Assessment & Plan:

## 2010-11-20 NOTE — Assessment & Plan Note (Signed)
ambien did not seem to help.  Will try Lunesta 3 mg qhs.  Savings card given.  Regular exercise and lifestyle factors are important also.

## 2010-11-20 NOTE — Patient Instructions (Signed)
Stop Zoloft. Check CBC today.  Start Wellbutrin tomorrow morning for mood.  Use Lunesta at bedtime for sleep in place of [place].  Call me if any problems.  Return for f/u in 2 mos.

## 2010-11-20 NOTE — Assessment & Plan Note (Signed)
Will check CBC today to be sure zoloft did not disrupt platelet function and cause irreg vag bleeding.  She sees to be better now that she's weaning off.  I will change her to wellbutrin 150 mg XL once daily.  Call if any problems.

## 2010-11-20 NOTE — Telephone Encounter (Signed)
LMOM informing Pt of the above 

## 2010-11-20 NOTE — Telephone Encounter (Signed)
Pls let pt know that her bloodwork came back normal. 

## 2010-12-11 ENCOUNTER — Encounter: Payer: BC Managed Care – PPO | Admitting: Family Medicine

## 2010-12-12 NOTE — Progress Notes (Signed)
  Subjective:    Patient ID: Carolyn [name], female    DOB: 03/18/1956, 55 y.o.   MRN: [id number]  HPI error  Review of Systems     Objective:   Physical Exam        Assessment & Plan:

## 2010-12-18 ENCOUNTER — Other Ambulatory Visit: Payer: Self-pay | Admitting: Family Medicine

## 2010-12-30 ENCOUNTER — Other Ambulatory Visit: Payer: Self-pay | Admitting: Family Medicine

## 2011-01-12 ENCOUNTER — Telehealth: Payer: Self-pay | Admitting: Family Medicine

## 2011-01-12 ENCOUNTER — Ambulatory Visit (INDEPENDENT_AMBULATORY_CARE_PROVIDER_SITE_OTHER): Payer: BC Managed Care – PPO | Admitting: Family Medicine

## 2011-01-12 ENCOUNTER — Ambulatory Visit
Admission: RE | Admit: 2011-01-12 | Discharge: 2011-01-12 | Disposition: A | Discharge status: Home / Self Care | Payer: BC Managed Care – PPO | Source: Ambulatory Visit | Attending: Family Medicine | Admitting: Family Medicine

## 2011-01-12 ENCOUNTER — Encounter: Payer: Self-pay | Admitting: Family Medicine

## 2011-01-12 VITALS — BP 131/84 | HR 54 | Temp 98.4°F | Ht 62.0 in | Wt 206.0 lb

## 2011-01-12 DIAGNOSIS — N390 Urinary tract infection, site not specified: Secondary | ICD-10-CM

## 2011-01-12 DIAGNOSIS — N23 Unspecified renal colic: Secondary | ICD-10-CM

## 2011-01-12 LAB — POCT URINALYSIS DIPSTICK
Bilirubin, UA: NEGATIVE
Glucose, UA: NEGATIVE
Nitrite, UA: NEGATIVE
Urobilinogen, UA: 0.2

## 2011-01-12 NOTE — Telephone Encounter (Signed)
LMOM informing Pt  

## 2011-01-12 NOTE — Progress Notes (Signed)
  Subjective:    Patient ID: Carolyn [name], female    DOB: 1955/08/26, 55 y.o.   MRN: [id number]  HPI  55 yo AAF presents for continued pain in the L flank.  She has gone to [employer] and to [facility].  She was told that she had kidney stones and gallstones.  She was given Vicodin for pain.  She took antibiotics 3 wks ago and had a normal cath urine 2 wks ago.  Her L side still sore all the time.  She has chronic lower back pain.  Denies dysuria but has increased frequency.  No urgency.  No vaginal discharge but has had itching.  She had a fever earlier this wk.    BP 131/84  Pulse 54  Temp(Src) 98.4 F (36.9 C) (Oral)  Ht 5\' 2"  (1.575 m)  Wt 206 lb (93.441 kg)  BMI 37.68 kg/m2  SpO2 96%   Review of Systems  Constitutional: Positive for fever.  Respiratory: Negative for shortness of breath.   Cardiovascular: Negative for chest pain.  Gastrointestinal: Positive for nausea and abdominal pain. Negative for vomiting, diarrhea and blood in stool.  Genitourinary: Positive for frequency, hematuria, flank pain, vaginal discharge and pelvic pain. Negative for dysuria, urgency, enuresis and difficulty urinating.       Objective:   Physical Exam  Constitutional: She appears well-developed and well-nourished.  HENT:  Mouth/Throat: Oropharynx is clear and moist.  Eyes: No scleral icterus.  Cardiovascular: Normal rate, regular rhythm and normal heart sounds.   Pulmonary/Chest: Effort normal and breath sounds normal.  Abdominal: Soft. Bowel sounds are normal. There is tenderness (mild suprapubic TTP). There is no guarding.       No CVAT  Musculoskeletal: She exhibits no edema.  Skin: Skin is warm and dry.  Psychiatric: She has a normal mood and affect.          Assessment & Plan:   No problem-specific assessment & plan notes found for this encounter.

## 2011-01-12 NOTE — Assessment & Plan Note (Signed)
I reviewed her CT renal scan done at [facility] 9 days ago + for L sided hydronephrosis w/o presence of a stone.  She continues to have some symptoms.  UA has trace blood and is not overwhelming for infection today.  Sent for cx to follow up.  Will get a KUB to see if anything is in the ureter today.  She has pain meds from the ED to use if needed.  Increase water intake, given a strainer for her urine.   Has f/u with me 8-6.  Will check a CMP at that time for ? Gallstones.

## 2011-01-12 NOTE — Patient Instructions (Signed)
Xray abdomen today downstairs.  Will call you w/ result this afternoon and with urine culture results on Monday.  Drink plenty of water/ cranberry juice. Use pain meds as needed.

## 2011-01-12 NOTE — Telephone Encounter (Signed)
Pls let pt know that her abdominal xray came back normal.  No sign of stones. Plan to recheck urine with labs at her 8-6 f/u visit.

## 2011-01-14 ENCOUNTER — Telehealth: Payer: Self-pay | Admitting: Family Medicine

## 2011-01-14 LAB — URINE CULTURE: Colony Count: >=100000

## 2011-01-14 NOTE — Telephone Encounter (Signed)
Pls let pt know that her urine culture was contaminated (too many bacterial species) which is essentially negative for infection.  Let me know if symptoms continue.

## 2011-01-15 NOTE — Telephone Encounter (Signed)
Pt aware.

## 2011-01-22 ENCOUNTER — Encounter: Payer: Self-pay | Admitting: Family Medicine

## 2011-01-22 ENCOUNTER — Ambulatory Visit (INDEPENDENT_AMBULATORY_CARE_PROVIDER_SITE_OTHER): Payer: BC Managed Care – PPO | Admitting: Family Medicine

## 2011-01-22 VITALS — BP 126/68 | HR 61 | Wt 206.0 lb

## 2011-01-22 DIAGNOSIS — F339 Major depressive disorder, recurrent, unspecified: Secondary | ICD-10-CM

## 2011-01-22 DIAGNOSIS — N390 Urinary tract infection, site not specified: Secondary | ICD-10-CM

## 2011-01-22 MED ORDER — ESZOPICLONE 3 MG PO TABS: 3.0000 mg | ORAL_TABLET | Freq: Every evening | ORAL | Status: DC | PRN

## 2011-01-22 MED ORDER — BUPROPION HCL ER (XL) 300 MG PO TB24: 300.0000 mg | ORAL_TABLET | Freq: Every day | ORAL | Status: DC

## 2011-01-22 MED ORDER — ALBUTEROL SULFATE HFA 108 (90 BASE) MCG/ACT IN AERS: 2.0000 | INHALATION_SPRAY | Freq: Four times a day (QID) | RESPIRATORY_TRACT | Status: DC | PRN

## 2011-01-22 MED ORDER — FLUTICASONE-SALMETEROL 250-50 MCG/DOSE IN AEPB: 1.0000 | INHALATION_SPRAY | Freq: Two times a day (BID) | RESPIRATORY_TRACT | Status: DC

## 2011-01-22 NOTE — Patient Instructions (Signed)
Change Wellbutrin to 300 mg XL daily for mood.  Use Lunesta as needed for sleep.  Try Cystex Pure Cranberry for dysuria.  Drink plenty of water, try to eat healthy and exercise regularly.  Call if any problems.  Lab today. Will call you w/ results tomorrow.  Return for f/u in 3 mos.

## 2011-01-22 NOTE — Progress Notes (Signed)
  Subjective:    Patient ID: Carolyn [name], female    DOB: [birth year], 55 y.o.   MRN: [id number]  HPI  55 yo AAF presents for f/u depression and sleep problems.  I changed her SSRI to Wellbutrin 2 mos ago.  Her sleep has imporved on Lunesta, using 2-3 x  Wk.  She still has a lot of stress.  She still feels tired some.  She has started to exercise more.  Denies ad verse Ses.  Has a good support system.  BP 126/68  Pulse 61  Wt 206 lb (93.441 kg)   Review of Systems  Psychiatric/Behavioral: Positive for sleep disturbance. Negative for suicidal ideas and dysphoric mood. The patient is not nervous/anxious.        Objective:   Physical Exam  Constitutional: She appears well-developed and well-nourished.  Psychiatric: She has a normal mood and affect.          Assessment & Plan:

## 2011-01-22 NOTE — Assessment & Plan Note (Signed)
PHQ 9 score is 7 today c/w mild depression.  Will go up on her wellbutrin xl from 150 to 300 mg once daily.  Call if any problems.  Will continue to work on sleep, exercise and healthy diet.

## 2011-01-25 ENCOUNTER — Encounter: Payer: Self-pay | Admitting: *Deleted

## 2011-01-25 ENCOUNTER — Telehealth: Payer: Self-pay | Admitting: Family Medicine

## 2011-01-25 LAB — COMPLETE METABOLIC PANEL WITH GFR
ALT: 15 U/L (ref 0–35)
AST: 17 U/L (ref 0–37)
Albumin: 3.9 g/dL (ref 3.5–5.2)
Alkaline Phosphatase: 43 U/L (ref 39–117)
BUN: 15 mg/dL (ref 6–23)
Chloride: 108 mEq/L (ref 96–112)
Potassium: 3.9 mEq/L (ref 3.5–5.3)
Sodium: 139 mEq/L (ref 135–145)
Total Protein: 6.6 g/dL (ref 6.0–8.3)

## 2011-01-25 NOTE — Telephone Encounter (Signed)
Pls let pt know that her chemistry panel came back normal..

## 2011-01-25 NOTE — Telephone Encounter (Signed)
Letter sent to pt. KJ LPN

## 2011-01-25 NOTE — Telephone Encounter (Signed)
Tried to call pt- house number disconnected. Called work number- wrong number. KJ LPN

## 2011-03-09 ENCOUNTER — Other Ambulatory Visit: Payer: Self-pay | Admitting: Family Medicine

## 2011-03-09 MED ORDER — ESZOPICLONE 3 MG PO TABS: 3.0000 mg | ORAL_TABLET | Freq: Every evening | ORAL | Status: DC | PRN

## 2011-03-09 NOTE — Telephone Encounter (Signed)
Refill request for lunesta 3 mg. Plan:  Medication refilled for # 20/1 refill. Jarvis Newcomer, LPN [facility]

## 2011-03-20 ENCOUNTER — Other Ambulatory Visit: Payer: Self-pay | Admitting: Family Medicine

## 2011-03-20 MED ORDER — ZOLPIDEM TARTRATE 10 MG PO TABS: 10.0000 mg | ORAL_TABLET | Freq: Every evening | ORAL | Status: AC | PRN

## 2011-03-21 ENCOUNTER — Telehealth: Payer: Self-pay | Admitting: *Deleted

## 2011-03-21 NOTE — Telephone Encounter (Signed)
OK to put on sched for tomorrow.

## 2011-03-21 NOTE — Telephone Encounter (Signed)
Pt states right up under her breast hurts when breathes. WHen coughs no pain- pt states has been using inhalers more than she normally would and gets releif for about an hour. Dry cough, some wheezing. Please advise.

## 2011-03-21 NOTE — Telephone Encounter (Signed)
Scheduled appt.

## 2011-03-22 ENCOUNTER — Encounter: Payer: Self-pay | Admitting: Family Medicine

## 2011-03-22 ENCOUNTER — Ambulatory Visit
Admission: RE | Admit: 2011-03-22 | Discharge: 2011-03-22 | Disposition: A | Discharge status: Home / Self Care | Payer: BC Managed Care – PPO | Source: Ambulatory Visit | Attending: Family Medicine | Admitting: Family Medicine

## 2011-03-22 ENCOUNTER — Ambulatory Visit (INDEPENDENT_AMBULATORY_CARE_PROVIDER_SITE_OTHER): Payer: BC Managed Care – PPO | Admitting: Family Medicine

## 2011-03-22 VITALS — BP 122/87 | HR 63 | Temp 98.5°F | Wt 199.0 lb

## 2011-03-22 DIAGNOSIS — R0789 Other chest pain: Secondary | ICD-10-CM

## 2011-03-22 DIAGNOSIS — Z23 Encounter for immunization: Secondary | ICD-10-CM

## 2011-03-22 MED ORDER — INFLUENZA VIRUS VACC SPLIT PF IM SUSP: 0.5000 mL | Freq: Once | INTRAMUSCULAR | Status: DC

## 2011-03-22 NOTE — Progress Notes (Signed)
Subjective:    Patient ID: Carolyn [name], female    DOB: 1955/10/27, 55 y.o.   MRN: [id number]  HPI Chest pain under the left breast. Worse with deep breath.  Hs been using her inhaler some more.  Slight cough. No fever.  Can happen at rest or activity.  Never had problems with her heart before.  Using her albuterol multiple times a day.  Mild ST.  Mild ear pressure.  Mild nasal congestion. Using cough drops. Sill on her advair.  Stopped the allegra secondary H. No other antihistamines. Pain is persistant and sometimes worse afte eating. No heartburn per se.    Review of Systems     BP 122/87  Pulse 63  Temp(Src) 98.5 F (36.9 C) (Oral)  Wt 199 lb (90.266 kg)  SpO2 98%  PF 350 L/min    Allergies  Allergen Reactions  . Ace Inhibitors     REACTION: cough, also with ARBS    Past Medical History  Diagnosis Date  . Constipation   . Insomnia   . Eczema   . Depression   . Arthritis     neck  . Asthma     mild persistent  . SUI (stress urinary incontinence, female)   . Female bladder prolapse     No past surgical history on file.  History   Social History  . Marital Status: Married    Spouse Name: N/A    Number of Children: N/A  . Years of Education: N/A   Occupational History  . Not on file.   Social History Main Topics  . Smoking status: Never Smoker   . Smokeless tobacco: Not on file  . Alcohol Use: No  . Drug Use: Not on file  . Sexually Active: Not on file   Other Topics Concern  . Not on file   Social History Narrative  . No narrative on file    Family History  Problem Relation Age of Onset  . Hypertension Father   . Hypertension Sister   . Hypertension Sister   . Cancer Other     breast- late 58's    Ms. Yera had no medications administered during this visit.  Objective:   Physical Exam  Constitutional: She is oriented to person, place, and time. She appears well-developed.  HENT:  Head: Normocephalic and atraumatic.  Eyes:  Conjunctivae are normal. Pupils are equal, round, and reactive to light.  Cardiovascular: Normal rate, regular rhythm and normal heart sounds.        No carotid bruit.   Pulmonary/Chest: Effort normal and breath sounds normal.  Abdominal: Soft. Bowel sounds are normal. She exhibits no distension and no mass. There is no tenderness. There is no rebound and no guarding.  Neurological: She is alert and oriented to person, place, and time.  Skin: Skin is warm and dry.  Psychiatric: She has a normal mood and affect. Her behavior is normal.          Assessment & Plan:  Atypical chest Pain - Consider GERD vs asthma vs PNA vs cardiac.  Less likely to be cardiac. EKG shows rate of 55 bpm, NSR, inverte T wave in lead 3.  No acute changes.  Given samples of dexilant to try for 5 days.  Will get CXR and labs to rule out infection or peumonia. No sign of volume overload on exam. Peak flows in the green zone which is reassuring that this si not her asthma. Will call her with the reults.  Go to the ED if pan suddenly gets worse. Reviewed the sxs for cardiac dz.

## 2011-03-22 NOTE — Patient Instructions (Signed)
Dexilant once a day about 20 min before breakfast We will call you with your labs Avoid greasy, spicey, acidic foods. Avoid caffeine and carbonated beverages.

## 2011-03-23 ENCOUNTER — Telehealth: Payer: Self-pay | Admitting: *Deleted

## 2011-03-23 LAB — CBC WITH DIFFERENTIAL/PLATELET
Basophils Absolute: 0 10*3/uL (ref 0.0–0.1)
Eosinophils Absolute: 0.2 10*3/uL (ref 0.0–0.7)
Eosinophils Relative: 4 % (ref 0–5)
HCT: 40 % (ref 36.0–46.0)
Lymphocytes Relative: 34 % (ref 12–46)
MCH: 27.2 pg (ref 26.0–34.0)
MCV: 84.4 fL (ref 78.0–100.0)
Monocytes Absolute: 0.3 10*3/uL (ref 0.1–1.0)
RDW: 16.4 % — ABNORMAL HIGH (ref 11.5–15.5)
WBC: 5.4 10*3/uL (ref 4.0–10.5)

## 2011-03-23 LAB — COMPLETE METABOLIC PANEL WITH GFR
BUN: 19 mg/dL (ref 6–23)
CO2: 22 mEq/L (ref 19–32)
Calcium: 9.3 mg/dL (ref 8.4–10.5)
Chloride: 103 mEq/L (ref 96–112)
Creat: 0.94 mg/dL (ref 0.50–1.10)
GFR, Est African American: >60 mL/min (ref >60)

## 2011-03-23 LAB — LIPASE: Lipase: <10 U/L (ref 0–75)

## 2011-03-23 NOTE — Telephone Encounter (Signed)
Pt.notified

## 2011-03-23 NOTE — Telephone Encounter (Signed)
Message copied by Wyline Beady on Fri Mar 23, 2011  8:46 AM ------      Message from: Nani Gasser D      Created: Thu Mar 22, 2011 12:53 PM       CXR is normal. No sign of infection or PNA.

## 2011-03-27 ENCOUNTER — Other Ambulatory Visit: Payer: Self-pay | Admitting: Family Medicine

## 2011-03-27 MED ORDER — OMEPRAZOLE 40 MG PO CPDR: 40.0000 mg | DELAYED_RELEASE_CAPSULE | Freq: Every day | ORAL | Status: DC

## 2011-04-02 ENCOUNTER — Encounter: Payer: Self-pay | Admitting: Family Medicine

## 2011-04-04 ENCOUNTER — Telehealth: Payer: Self-pay | Admitting: Family Medicine

## 2011-04-04 NOTE — Telephone Encounter (Signed)
Closed

## 2011-04-04 NOTE — Telephone Encounter (Signed)
Pt is calling requesting samples of advair but she did not leave the specific strength.   Plan:  [facility] for the pt letting her know that there are no advair samples at current. Jarvis Newcomer, LPN [facility]

## 2011-04-06 ENCOUNTER — Other Ambulatory Visit: Payer: Self-pay | Admitting: Family Medicine

## 2011-04-09 ENCOUNTER — Other Ambulatory Visit: Payer: Self-pay | Admitting: *Deleted

## 2011-04-09 MED ORDER — ALBUTEROL SULFATE HFA 108 (90 BASE) MCG/ACT IN AERS: 2.0000 | INHALATION_SPRAY | Freq: Four times a day (QID) | RESPIRATORY_TRACT | Status: DC | PRN

## 2011-04-24 ENCOUNTER — Ambulatory Visit (INDEPENDENT_AMBULATORY_CARE_PROVIDER_SITE_OTHER): Payer: BC Managed Care – PPO | Admitting: Family Medicine

## 2011-04-24 ENCOUNTER — Encounter: Payer: Self-pay | Admitting: Family Medicine

## 2011-04-24 VITALS — BP 137/89 | HR 57 | Ht 61.0 in | Wt 202.0 lb

## 2011-04-24 DIAGNOSIS — IMO0001 Reserved for inherently not codable concepts without codable children: Secondary | ICD-10-CM

## 2011-04-24 DIAGNOSIS — F32A Depression, unspecified: Secondary | ICD-10-CM

## 2011-04-24 DIAGNOSIS — J309 Allergic rhinitis, unspecified: Secondary | ICD-10-CM

## 2011-04-24 DIAGNOSIS — J45901 Unspecified asthma with (acute) exacerbation: Secondary | ICD-10-CM

## 2011-04-24 DIAGNOSIS — F329 Major depressive disorder, single episode, unspecified: Secondary | ICD-10-CM

## 2011-04-24 MED ORDER — MONTELUKAST SODIUM 10 MG PO TABS: 10.0000 mg | ORAL_TABLET | Freq: Every day | ORAL | Status: DC

## 2011-04-24 MED ORDER — ZOLPIDEM TARTRATE 10 MG PO TABS: 10.0000 mg | ORAL_TABLET | Freq: Every evening | ORAL | Status: AC | PRN

## 2011-04-24 MED ORDER — FEXOFENADINE-PSEUDOEPHED ER 180-240 MG PO TB24: 1.0000 | ORAL_TABLET | Freq: Every day | ORAL | Status: AC

## 2011-04-24 MED ORDER — FLUTICASONE PROPIONATE 50 MCG/ACT NA SUSP: 2.0000 | Freq: Every day | NASAL | Status: DC

## 2011-04-24 MED ORDER — FLUTICASONE-SALMETEROL 500-50 MCG/DOSE IN AEPB: 1.0000 | INHALATION_SPRAY | Freq: Two times a day (BID) | RESPIRATORY_TRACT | Status: DC

## 2011-04-24 MED ORDER — BUPROPION HCL ER (XL) 150 MG PO TB24: 150.0000 mg | ORAL_TABLET | Freq: Every day | ORAL | Status: DC

## 2011-04-24 NOTE — Progress Notes (Signed)
Subjective:    Patient ID: Carolyn [name], female    DOB: July 31, 1955, 55 y.o.   MRN: [id number]  Hypertension Associated symptoms include headaches, malaise/fatigue and shortness of breath. Pertinent negatives include no chest pain or sweats.  Asthma She complains of chest tightness, cough, difficulty breathing, frequent throat clearing, hoarse voice, shortness of breath and wheezing. There is no hemoptysis or sputum production. This is a recurrent problem. The current episode started more than 1 month ago. The problem occurs intermittently. The problem has been waxing and waning. The cough is non-productive. Associated symptoms include headaches, heartburn, malaise/fatigue, nasal congestion, postnasal drip, sneezing and a sore throat. Pertinent negatives include no appetite change, chest pain, dyspnea on exertion, ear congestion, ear pain, fever, myalgias, rhinorrhea, sweats, trouble swallowing or weight loss. Her symptoms are aggravated by change in weather. Her symptoms are alleviated by beta-agonist. She reports moderate improvement on treatment. Risk factors for lung disease include no known risk factors. Her past medical history is significant for asthma. There is no history of bronchitis, COPD or emphysema.      Review of Systems  Constitutional: Positive for malaise/fatigue. Negative for fever, weight loss and appetite change.  HENT: Positive for sore throat, hoarse voice, sneezing and postnasal drip. Negative for ear pain, rhinorrhea and trouble swallowing.   Respiratory: Positive for cough, shortness of breath and wheezing. Negative for hemoptysis and sputum production.   Cardiovascular: Negative for chest pain and dyspnea on exertion.  Gastrointestinal: Positive for heartburn.  Musculoskeletal: Negative for myalgias.  Neurological: Positive for headaches.  Psychiatric/Behavioral:       Depression  insomnia   BP 137/89  Pulse 57  Ht 5\' 1"  (1.549 m)  Wt 202 lb (91.627 kg)  BMI  38.17 kg/m2  SpO2 99%  LMP 04/19/2011     Objective:   Physical Exam  Vitals reviewed. Constitutional: She is oriented to person, place, and time. Vital signs are normal. She appears well-developed and well-nourished.  HENT:  Head: Normocephalic.  Eyes:       Marked allergi shiners over both eyes.  Neck: Normal range of motion. Neck supple.  Cardiovascular: Normal rate, regular rhythm and normal heart sounds.   Pulmonary/Chest: Effort normal and breath sounds normal. No respiratory distress. She has no wheezes. She has no rales.  Musculoskeletal: Normal range of motion.  Neurological: She is alert and oriented to person, place, and time.  Skin: Skin is warm.  Psychiatric: She has a normal mood and affect.          Assessment & Plan:  #1 asthma with severe exacerbation of her asthma over the last 2 months and using water Advair Diskus for the time being we'll going to increase Advair Diskus to 500/50 one puff twice a day and stressed her the importance of only taking it as prescribed. The full air albuterol inhaler she can use every 2 hours if needed and asked to address the inhaler. #2 allergies nasal congestion with nasal congestion allergies and asthma were going to place her on Singulair 10 mg one tablet a day I think that will help the nasal congestion and the asthma also placed on Flonase nasal spray 2 puffs is nostril discuss with her the need to check on the chest and huskies nasal spray to help with make a difference as well #3 insomnia she is having trouble sleeping she wants to try the Ambien 10 mg apparently Pronestyl is too expensive we'll give a prescription for Ambien 10 mg #31 by  mouth at night he's undergone basis with 2 refills. #4 depression will return was too strong for her at the current doses when she cut the dosage in half so depression got better and she doesn't feel the side effects from the Wellbutrin she likes milk which give her a lower dose of Wellbutrin  (will give her a dose of 150 and explained to her that if the depression is worse to please go back up to 300 mg) #5 hypertension maintain current blood medication blood pressure is 137/89. #6 follow up w/Dr Linford Arnold  in 4 months.

## 2011-04-24 NOTE — Patient Instructions (Signed)
Allergies, Generic Allergies may happen from anything your body is sensitive to. This may be food, medicines, pollens, chemicals, and nearly anything around you in everyday life that produces allergens. An allergen is anything that causes an allergy producing substance. Heredity is often a factor in causing these problems. This means you may have some of the same allergies as your parents. Food allergies happen in all age groups. Food allergies are some of the most severe and life threatening. Some common food allergies are cow's milk, seafood, eggs, nuts, wheat, and soybeans. SYMPTOMS  Swelling around the mouth.  An itchy red rash or hives.  Vomiting or diarrhea.  Difficulty breathing.  SEVERE ALLERGIC REACTIONS ARE LIFE-THREATENING. This reaction is called anaphylaxis. It can cause the mouth and throat to swell and cause difficulty with breathing and swallowing. In severe reactions only a trace amount of food (for example, peanut oil in a salad) may cause death within seconds. Seasonal allergies occur in all age groups. These are seasonal because they usually occur during the same season every year. They may be a reaction to molds, grass pollens, or tree pollens. Other causes of problems are house dust mite allergens, pet dander, and mold spores. The symptoms often consist of nasal congestion, a runny itchy nose associated with sneezing, and tearing itchy eyes. There is often an associated itching of the mouth and ears. The problems happen when you come in contact with pollens and other allergens. Allergens are the particles in the air that the body reacts to with an allergic reaction. This causes you to release allergic antibodies. Through a chain of events, these eventually cause you to release histamine into the blood stream. Although it is meant to be protective to the body, it is this release that causes your discomfort. This is why you were given anti-histamines to feel better. If you are unable  to pinpoint the offending allergen, it may be determined by skin or blood testing. Allergies cannot be cured but can be controlled with medicine. Hay fever is a collection of all or some of the seasonal allergy problems. It may often be treated with simple over-the-counter medicine such as diphenhydramine. Take medicine as directed. Do not drink alcohol or drive while taking this medicine. Check with your caregiver or package insert for child dosages. If these medicines are not effective, there are many new medicines your caregiver can prescribe. Stronger medicine such as nasal spray, eye drops, and corticosteroids may be used if the first things you try do not work well. Other treatments such as immunotherapy or desensitizing injections can be used if all else fails. Follow up with your caregiver if problems continue. These seasonal allergies are usually not life threatening. They are generally more of a nuisance that can often be handled using medicine. HOME CARE INSTRUCTIONS  If unsure what causes a reaction, keep a diary of foods eaten and symptoms that follow. Avoid foods that cause reactions.  If hives or rash are present:  Take medicine as directed.  You may use an over-the-counter antihistamine (diphenhydramine) for hives and itching as needed.  Apply cold compresses (cloths) to the skin or take baths in cool water. Avoid hot baths or showers. Heat will make a rash and itching worse.  If you are severely allergic:  Following a treatment for a severe reaction, hospitalization is often required for closer follow-up.  Wear a medic-alert bracelet or necklace stating the allergy.  You and your family must learn how to give adrenaline or use  an anaphylaxis kit.  If you have had a severe reaction, always carry your anaphylaxis kit or EpiPen with you. Use this medicine as directed by your caregiver if a severe reaction is occurring. Failure to do so could have a fatal outcome.  SEEK MEDICAL CARE  IF: You suspect a food allergy. Symptoms generally happen within 30 minutes of eating a food.  Your symptoms have not gone away within 2 days or are getting worse.  You develop new symptoms.  You want to retest yourself or your child with a food or drink you think causes an allergic reaction. Never do this if an anaphylactic reaction to that food or drink has happened before. Only do this under the care of a caregiver.  SEEK IMMEDIATE MEDICAL CARE IF:  You have difficulty breathing, are wheezing, or have a tight feeling in your chest or throat.  You have a swollen mouth, or you have hives, swelling, or itching all over your body.  You have had a severe reaction that has responded to your anaphylaxis kit or an EpiPen. These reactions may return when the medicine has worn off. These reactions should be considered life threatening.  MAKE SURE YOU:  Understand these instructions.  Will watch your condition.  Will get help right away if you are not doing well or get worse.  Document Released: 08/28/2002 Document Revised: 02/14/2011 Document Reviewed: 02/02/2008 [facility] Patient Information 2012 [place], [place].Asthma Attack Prevention HOW CAN ASTHMA BE PREVENTED? Currently, there is no way to prevent asthma from starting. However, you can take steps to control the disease and prevent its symptoms after you have been diagnosed. Learn about your asthma and how to control it. Take an active role to control your asthma by working with your caregiver to create and follow an asthma action plan. An asthma action plan guides you in taking your medicines properly, avoiding factors that make your asthma worse, tracking your level of asthma control, responding to worsening asthma, and seeking emergency care when needed. To track your asthma, keep records of your symptoms, check your peak flow number using a peak flow meter (handheld device that shows how well air moves out of your lungs), and get regular asthma  checkups.  Other ways to prevent asthma attacks include:  Use medicines as your caregiver directs.   Identify and avoid things that make your asthma worse (as much as you can).   Keep track of your asthma symptoms and level of control.   Get regular checkups for your asthma.   With your caregiver, write a detailed plan for taking medicines and managing an asthma attack. Then be sure to follow your action plan. Asthma is an ongoing condition that needs regular monitoring and treatment.   Identify and avoid asthma triggers. A number of outdoor allergens and irritants (pollen, mold, cold air, air pollution) can trigger asthma attacks. Find out what causes or makes your asthma worse, and take steps to avoid those triggers (see below).   Monitor your breathing. Learn to recognize warning signs of an attack, such as slight coughing, wheezing or shortness of breath. However, your lung function may already decrease before you notice any signs or symptoms, so regularly measure and record your peak airflow with a home peak flow meter.   Identify and treat attacks early. If you act quickly, you're less likely to have a severe attack. You will also need less medicine to control your symptoms. When your peak flow measurements decrease and alert you to an  upcoming attack, take your medicine as instructed, and immediately stop any activity that may have triggered the attack. If your symptoms do not improve, get medical help.   Pay attention to increasing quick-relief inhaler use. If you find yourself relying on your quick-relief inhaler (such as albuterol), your asthma is not under control. See your caregiver about adjusting your treatment.  IDENTIFY AND CONTROL FACTORS THAT MAKE YOUR ASTHMA WORSE A number of common things can set off or make your asthma symptoms worse (asthma triggers). Keep track of your asthma symptoms for several weeks, detailing all the environmental and emotional factors that are linked  with your asthma. When you have an asthma attack, go back to your asthma diary to see which factor, or combination of factors, might have contributed to it. Once you know what these factors are, you can take steps to control many of them.  Allergies: If you have allergies and asthma, it is important to take asthma prevention steps at home. Asthma attacks (worsening of asthma symptoms) can be triggered by allergies, which can cause temporary increased inflammation of your airways. Minimizing contact with the substance to which you are allergic will help prevent an asthma attack. Animal Dander:   Some people are allergic to the flakes of skin or dried saliva from animals with fur or feathers. Keep these pets out of your home.   If you can't keep a pet outdoors, keep the pet out of your bedroom and other sleeping areas at all times, and keep the door closed.   Remove carpets and furniture covered with cloth from your home. If that is not possible, keep the pet away from fabric-covered furniture and carpets.  Dust Mites:  Many people with asthma are allergic to dust mites. Dust mites are tiny bugs that are found in every home, in mattresses, pillows, carpets, fabric-covered furniture, bedcovers, clothes, stuffed toys, fabric, and other fabric-covered items.   Cover your mattress in a special dust-proof cover.   Cover your pillow in a special dust-proof cover, or wash the pillow each week in hot water. Water must be hotter than 130 F to kill dust mites. Cold or warm water used with detergent and bleach can also be effective.   Wash the sheets and blankets on your bed each week in hot water.   Try not to sleep or lie on cloth-covered cushions.   Call ahead when traveling and ask for a smoke-free hotel room. Bring your own bedding and pillows, in case the hotel only supplies feather pillows and down comforters, which may contain dust mites and cause asthma symptoms.   Remove carpets from your  bedroom and those laid on concrete, if you can.   Keep stuffed toys out of the bed, or wash the toys weekly in hot water or cooler water with detergent and bleach.  Cockroaches:  Many people with asthma are allergic to the droppings and remains of cockroaches.   Keep food and garbage in closed containers. Never leave food out.   Use poison baits, traps, powders, gels, or paste (for example, boric acid).   If a spray is used to kill cockroaches, stay out of the room until the odor goes away.  Indoor Mold:  Fix leaky faucets, pipes, or other sources of water that have mold around them.   Clean moldy surfaces with a cleaner that has bleach in it.  Pollen and Outdoor Mold:  When pollen or mold spore counts are high, try to keep your windows closed.  Stay indoors with windows closed from late morning to afternoon, if you can. Pollen and some mold spore counts are highest at that time.   Ask your caregiver whether you need to take or increase anti-inflammatory medicine before your allergy season starts.  Irritants:   Tobacco smoke is an irritant. If you smoke, ask your caregiver how you can quit. Ask family members to quit smoking, too. Do not allow smoking in your home or car.   If possible, do not use a wood-burning stove, kerosene heater, or fireplace. Minimize exposure to all sources of smoke, including incense, candles, fires, and fireworks.   Try to stay away from strong odors and sprays, such as perfume, talcum powder, hair spray, and paints.   Decrease humidity in your home and use an indoor air cleaning device. Reduce indoor humidity to below 60 percent. Dehumidifiers or central air conditioners can do this.   Try to have someone else vacuum for you once or twice a week, if you can. Stay out of rooms while they are being vacuumed and for a short while afterward.   If you vacuum, use a dust mask from a hardware store, a double-layered or microfilter vacuum cleaner bag, or a  vacuum cleaner with a HEPA filter.   Sulfites in foods and beverages can be irritants. Do not drink beer or wine, or eat dried fruit, processed potatoes, or shrimp if they cause asthma symptoms.   Cold air can trigger an asthma attack. Cover your nose and mouth with a scarf on cold or windy days.   Several health conditions can make asthma more difficult to manage, including runny nose, sinus infections, reflux disease, psychological stress, and sleep apnea. Your caregiver will treat these conditions, as well.   Avoid close contact with people who have a cold or the flu, since your asthma symptoms may get worse if you catch the infection from them. Wash your hands thoroughly after touching items that may have been handled by people with a respiratory infection.   Get a flu shot every year to protect against the flu virus, which often makes asthma worse for days or weeks. Also get a pneumonia shot once every five to 10 years.  Drugs:  Aspirin and other painkillers can cause asthma attacks. 10% to 20% of people with asthma have sensitivity to aspirin or a group of painkillers called non-steroidal anti-inflammatory drugs (NSAIDS), such as ibuprofen and naproxen. These drugs are used to treat pain and reduce fevers. Asthma attacks caused by any of these medicines can be severe and even fatal. These drugs must be avoided in people who have known aspirin sensitive asthma. Products with acetaminophen are considered safe for people who have asthma. It is important that people with aspirin sensitivity read labels of all over-the-counter drugs used to treat pain, colds, coughs, and fever.   Beta blockers and ACE inhibitors are other drugs which you should discuss with your caregiver, in relation to your asthma.  ALLERGY SKIN TESTING  Ask your asthma caregiver about allergy skin testing or blood testing (RAST test) to identify the allergens to which you are sensitive. If you are found to have allergies,  allergy shots (immunotherapy) for asthma may help prevent future allergies and asthma. With allergy shots, small doses of allergens (substances to which you are allergic) are injected under your skin on a regular schedule. Over a period of time, your body may become used to the allergen and less responsive with asthma symptoms. You can also take  measures to minimize your exposure to those allergens. EXERCISE  If you have exercise-induced asthma, or are planning vigorous exercise, or exercise in cold, humid, or dry environments, prevent exercise-induced asthma by following your caregiver's advice regarding asthma treatment before exercising. Document Released: 05/23/2009 Document Revised: 02/14/2011 Document Reviewed: 05/23/2009 [facility] Patient Information 2012 [place], [place].

## 2011-04-25 DIAGNOSIS — F329 Major depressive disorder, single episode, unspecified: Secondary | ICD-10-CM | POA: Insufficient documentation

## 2011-04-25 DIAGNOSIS — IMO0001 Reserved for inherently not codable concepts without codable children: Secondary | ICD-10-CM | POA: Insufficient documentation

## 2011-04-25 DIAGNOSIS — F32A Depression, unspecified: Secondary | ICD-10-CM | POA: Insufficient documentation

## 2011-04-25 DIAGNOSIS — J309 Allergic rhinitis, unspecified: Secondary | ICD-10-CM | POA: Insufficient documentation

## 2011-05-19 ENCOUNTER — Other Ambulatory Visit: Payer: Self-pay | Admitting: Family Medicine

## 2011-06-05 ENCOUNTER — Other Ambulatory Visit: Payer: Self-pay | Admitting: Family Medicine

## 2011-07-06 ENCOUNTER — Other Ambulatory Visit: Payer: Self-pay | Admitting: Family Medicine

## 2011-07-24 ENCOUNTER — Other Ambulatory Visit: Payer: Self-pay | Admitting: Family Medicine

## 2011-07-30 ENCOUNTER — Other Ambulatory Visit: Payer: Self-pay | Admitting: *Deleted

## 2011-08-09 ENCOUNTER — Other Ambulatory Visit: Payer: Self-pay | Admitting: *Deleted

## 2011-08-16 ENCOUNTER — Encounter: Payer: Self-pay | Admitting: *Deleted

## 2011-08-22 ENCOUNTER — Ambulatory Visit: Payer: BC Managed Care – PPO | Admitting: Family Medicine

## 2011-08-22 DIAGNOSIS — Z0289 Encounter for other administrative examinations: Secondary | ICD-10-CM

## 2011-08-28 ENCOUNTER — Ambulatory Visit (INDEPENDENT_AMBULATORY_CARE_PROVIDER_SITE_OTHER): Payer: BC Managed Care – PPO | Admitting: Family Medicine

## 2011-08-28 ENCOUNTER — Encounter: Payer: Self-pay | Admitting: Family Medicine

## 2011-08-28 VITALS — BP 123/76 | HR 97 | Ht 61.0 in | Wt 201.0 lb

## 2011-08-28 DIAGNOSIS — J45901 Unspecified asthma with (acute) exacerbation: Secondary | ICD-10-CM

## 2011-08-28 DIAGNOSIS — G4700 Insomnia, unspecified: Secondary | ICD-10-CM

## 2011-08-28 DIAGNOSIS — K219 Gastro-esophageal reflux disease without esophagitis: Secondary | ICD-10-CM

## 2011-08-28 DIAGNOSIS — IMO0001 Reserved for inherently not codable concepts without codable children: Secondary | ICD-10-CM

## 2011-08-28 MED ORDER — DEXLANSOPRAZOLE 60 MG PO CPDR: 60.0000 mg | DELAYED_RELEASE_CAPSULE | Freq: Every day | ORAL | Status: AC

## 2011-08-28 NOTE — Patient Instructions (Addendum)
Insomnia Insomnia is frequent trouble falling and/or staying asleep. Insomnia can be a long term problem or a short term problem. Both are common. Insomnia can be a short term problem when the wakefulness is related to a certain stress or worry. Long term insomnia is often related to ongoing stress during waking hours and/or poor sleeping habits. Overtime, sleep deprivation itself can make the problem worse. Every little thing feels more severe because you are overtired and your ability to cope is decreased. CAUSES    Stress, anxiety, and depression.   Poor sleeping habits.   Distractions such as TV in the bedroom.   Naps close to bedtime.   Engaging in emotionally charged conversations before bed.   Technical reading before sleep.   Alcohol and other sedatives. They may make the problem worse. They can hurt normal sleep patterns and normal dream activity.   Stimulants such as caffeine for several hours prior to bedtime.   Pain syndromes and shortness of breath can cause insomnia.   Exercise late at night.   Changing time zones may cause sleeping problems (jet lag).  It is sometimes helpful to have someone observe your sleeping patterns. They should look for periods of not breathing during the night (sleep apnea). They should also look to see how long those periods last. If you live alone or observers are uncertain, you can also be observed at a sleep clinic where your sleep patterns will be professionally monitored. Sleep apnea requires a checkup and treatment. Give your caregivers your medical history. Give your caregivers observations your family has made about your sleep.   SYMPTOMS    Not feeling rested in the morning.   Anxiety and restlessness at bedtime.   Difficulty falling and staying asleep.  TREATMENT    Your caregiver may prescribe treatment for an underlying medical disorders. Your caregiver can give advice or help if you are using alcohol or other drugs for  self-medication. Treatment of underlying problems will usually eliminate insomnia problems.   Medications can be prescribed for short time use. They are generally not recommended for lengthy use.   Over-the-counter sleep medicines are not recommended for lengthy use. They can be habit forming.   You can promote easier sleeping by making lifestyle changes such as:   Using relaxation techniques that help with breathing and reduce muscle tension.   Exercising earlier in the day.   Changing your diet and the time of your last meal. No night time snacks.   Establish a regular time to go to bed.   Counseling can help with stressful problems and worry.   Soothing music and white noise may be helpful if there are background noises you cannot remove.   Stop tedious detailed work at least one hour before bedtime.  HOME CARE INSTRUCTIONS    Keep a diary. Inform your caregiver about your progress. This includes any medication side effects. See your caregiver regularly. Take note of:   Times when you are asleep.   Times when you are awake during the night.   The quality of your sleep.   How you feel the next day.  This information will help your caregiver care for you.  Get out of bed if you are still awake after 15 minutes. Read or do some quiet activity. Keep the lights down. Wait until you feel sleepy and go back to bed.   Keep regular sleeping and waking hours. Avoid naps.   Exercise regularly.   Avoid distractions at bedtime. Distractions  include watching television or engaging in any intense or detailed activity like attempting to balance the household checkbook.   Develop a bedtime ritual. Keep a familiar routine of bathing, brushing your teeth, climbing into bed at the same time each night, listening to soothing music. Routines increase the success of falling to sleep faster.   Use relaxation techniques. This can be using breathing and muscle tension release routines. It can  also include visualizing peaceful scenes. You can also help control troubling or intruding thoughts by keeping your mind occupied with boring or repetitive thoughts like the old concept of counting sheep. You can make it more creative like imagining planting one beautiful flower after another in your backyard garden.   During your day, work to eliminate stress. When this is not possible use some of the previous suggestions to help reduce the anxiety that accompanies stressful situations.  MAKE SURE YOU:    Understand these instructions.   Will watch your condition.   Will get help right away if you are not doing well or get worse.  Document Released: 06/01/2000 Document Revised: 05/24/2011 Document Reviewed: 07/02/2007 [facility] Patient Information 2012 [place], [place].Diet for GERD or PUD Nutrition therapy can help ease the discomfort of gastroesophageal reflux disease (GERD) and peptic ulcer disease (PUD).   HOME CARE INSTRUCTIONS    Eat your meals slowly, in a relaxed setting.   Eat 5 to 6 small meals per day.   If a food causes distress, stop eating it for a period of time.  FOODS TO AVOID  Coffee, regular or decaffeinated.   Cola beverages, regular or low calorie.   Tea, regular or decaffeinated.   Pepper.   Cocoa.   High fat foods, including meats.   Butter, margarine, hydrogenated oil (trans fats).   Peppermint or spearmint (if you have GERD).   Fruits and vegetables if not tolerated.   Alcohol.   Nicotine (smoking or chewing). This is one of the most potent stimulants to acid production in the gastrointestinal tract.   Any food that seems to aggravate your condition.  If you have questions regarding your diet, ask your caregiver or a registered dietitian. TIPS  Lying flat may make symptoms worse. Keep the head of your bed raised 6 to 9 inches (15 to 23 cm) by using a foam wedge or blocks under the legs of the bed.   Do not lay down until 3 hours after eating a  meal.   Daily physical activity may help reduce symptoms.  MAKE SURE YOU:    Understand these instructions.   Will watch your condition.   Will get help right away if you are not doing well or get worse.  Document Released: 06/04/2005 Document Revised: 05/24/2011 Document Reviewed: 04/20/2011 [facility] Patient Information 2012 [place], [place].

## 2011-08-28 NOTE — Progress Notes (Signed)
Subjective:    Patient ID: Carolyn [name], female    DOB: Oct 22, 1955, 56 y.o.   MRN: [id number]  HPI Asthma - Says her asthma feel uncontrolled.  C/o of chest tightness at night.  Feels tired all the time. Increased the advair back in the fall and added singulair.  Says using the proair every day at least once or twice. .  Some heartburn sxs as well.  Using her Allegra - D prn.   GERD- Occ chest pain, thinks from reflux. No reflux in her family. Chest pain can happen at rest when lying in best or when working at home.  Taking omeprazole 40mg  daly but feels it is not working. Says the singulair gives her HA. Says also stopped the blue pills Dr. Thurmond Butts has given her. Still drinking coffe in the AM but avoid greasy, spicey foods  Insomnia- She wants to wean off the med bc feels dependant on it.     Review of Systems BP 123/76  Pulse 97  Ht 5\' 1"  (1.549 m)  Wt 201 lb (91.173 kg)  BMI 37.98 kg/m2  PF 340 L/min    Allergies  Allergen Reactions  . Ace Inhibitors     REACTION: cough, also with [facility]BS  . Singulair Other (See Comments)    Headache     Past Medical History  Diagnosis Date  . Constipation   . Insomnia   . Eczema   . Depression   . Arthritis     neck  . Asthma     mild persistent  . SUI (stress urinary incontinence, female)   . Female bladder prolapse     No past surgical history on file.  History   Social History  . Marital Status: Married    Spouse Name: N/A    Number of Children: N/A  . Years of Education: N/A   Occupational History  . Not on file.   Social History Main Topics  . Smoking status: Never Smoker   . Smokeless tobacco: Not on file  . Alcohol Use: No  . Drug Use: No  . Sexually Active: Not on file   Other Topics Concern  . Not on file   Social History Narrative  . No narrative on file    Family History  Problem Relation Age of Onset  . Hypertension Father   . Hypertension Sister   . Hypertension Sister   . Cancer Other    breast- late 40's        Objective:   Physical Exam  Constitutional: She is oriented to person, place, and time. She appears well-developed and well-nourished.  HENT:  Head: Normocephalic and atraumatic.  Right Ear: External ear normal.  Left Ear: External ear normal.  Nose: Nose normal.  Mouth/Throat: Oropharynx is clear and moist.       TMs and canals are clear.   Eyes: Conjunctivae and EOM are normal. Pupils are equal, round, and reactive to light.  Neck: Neck supple. No thyromegaly present.  Cardiovascular: Normal rate, regular rhythm and normal heart sounds.   Pulmonary/Chest: Effort normal and breath sounds normal. She has no wheezes.  Lymphadenopathy:    She has no cervical adenopathy.  Neurological: She is alert and oriented to person, place, and time.  Skin: Skin is warm and dry.  Psychiatric: She has a normal mood and affect. Her behavior is normal.          Assessment & Plan:  Asthma -Her peak flows are in the green zone  today and her lung exam is normal. She hasn't use her abluterol today.  I set up a peak flow meter with her so she can actually see what zone she is in when she is having chest tightness and feels her asthma is flaring. Asked her call me in about a week to let me know what is happening with her peak flows. At this point, suspect that most of her chest discomfort and shortness of breath is actually related to her reflux instead of her asthma. Very itchy and spreading in the yellow zone is times when she checks it and we will need to adjust her regimen. I did discontinue the Singulair since been causing some headaches. She can continue her Allegra-D which she uses for allergies. Continue her albuterol and earlier as needed. I encouraged her to only use her Provera she's having a little red zones.  GERD-discontinue omeprazole and changed to Dexilant.  Samples were given today. We can see if we can get her chest pain and shortness of breath to improve by  changing her PPI. We also reviewed dietary changes. Decrease caffeine. Handout given.  INsomnia - we discussed her trunk her Ambien in half to 5 mg. We discussed the recent research indicates that the blood levels and Timentin hang around longer. If she does well on the 5 mg and we can certainly have her order this or rewrite any protection for 5 mg tablets and have her half dose and slowly wean off. We reviewed again some measures that she can take to help with sleep. Certainly the fact that she is perimenopausal could be contributing. If she wants she could try some over-the-counter black cohosh, evening primrose oil, or increasing soy in her diet. She said she might try that. She's not interested in hormone replacement therapy at this time.

## 2011-09-11 ENCOUNTER — Telehealth: Payer: Self-pay | Admitting: *Deleted

## 2011-09-11 MED ORDER — PANTOPRAZOLE SODIUM 40 MG PO TBEC: 40.0000 mg | DELAYED_RELEASE_TABLET | Freq: Every day | ORAL | Status: DC

## 2011-09-11 MED ORDER — ZOLPIDEM TARTRATE 5 MG PO TABS: 5.0000 mg | ORAL_TABLET | Freq: Every evening | ORAL | Status: DC | PRN

## 2011-09-11 NOTE — Telephone Encounter (Signed)
Pt is requesting a rx for ambien. Please advise if I can fill.

## 2011-09-11 NOTE — Telephone Encounter (Signed)
Will refill at the 5mg  dose.

## 2011-09-11 NOTE — Telephone Encounter (Signed)
Pt states her insurance does not cover dexilant and her insurance will cover patoprozole

## 2011-09-11 NOTE — Telephone Encounter (Signed)
rx sent

## 2011-09-18 ENCOUNTER — Ambulatory Visit: Payer: BC Managed Care – PPO | Admitting: Family Medicine

## 2011-09-24 ENCOUNTER — Other Ambulatory Visit: Payer: Self-pay | Admitting: Family Medicine

## 2011-10-02 ENCOUNTER — Encounter: Payer: Self-pay | Admitting: Family Medicine

## 2011-10-02 ENCOUNTER — Ambulatory Visit (INDEPENDENT_AMBULATORY_CARE_PROVIDER_SITE_OTHER): Payer: BC Managed Care – PPO | Admitting: Family Medicine

## 2011-10-02 VITALS — BP 141/89 | HR 68 | Ht 61.0 in | Wt 198.0 lb

## 2011-10-02 DIAGNOSIS — R5383 Other fatigue: Secondary | ICD-10-CM

## 2011-10-02 DIAGNOSIS — I10 Essential (primary) hypertension: Secondary | ICD-10-CM

## 2011-10-02 DIAGNOSIS — R110 Nausea: Secondary | ICD-10-CM

## 2011-10-02 DIAGNOSIS — R5381 Other malaise: Secondary | ICD-10-CM

## 2011-10-02 DIAGNOSIS — R51 Headache: Secondary | ICD-10-CM

## 2011-10-02 LAB — COMPLETE METABOLIC PANEL WITH GFR
ALT: 12 U/L (ref 0–35)
Albumin: 4 g/dL (ref 3.5–5.2)
CO2: 26 mEq/L (ref 19–32)
Chloride: 106 mEq/L (ref 96–112)
GFR, Est African American: >89 mL/min
GFR, Est Non African American: 89 mL/min
Glucose, Bld: 93 mg/dL (ref 70–99)
Potassium: 3.8 mEq/L (ref 3.5–5.3)
Sodium: 139 mEq/L (ref 135–145)
Total Protein: 6.7 g/dL (ref 6.0–8.3)

## 2011-10-02 MED ORDER — TRAZODONE HCL 50 MG PO TABS: 50.0000 mg | ORAL_TABLET | Freq: Every day | ORAL | Status: DC

## 2011-10-02 MED ORDER — TRAMADOL HCL 50 MG PO TABS: 50.0000 mg | ORAL_TABLET | Freq: Three times a day (TID) | ORAL | Status: AC | PRN

## 2011-10-02 NOTE — Progress Notes (Signed)
Subjective:    Patient ID: Carolyn [name], female    DOB: 06-02-1956, 56 y.o.   MRN: [id number]  HPI HA and nausea for about one month. Says happens almost everyday. Vomited once about 4 days ago. Feels extremely tired.  Feels slightly dizzy at times.  BP is up a little bitl Started protonix for acid reflux last time she was here. I have given her samples of excellent and partially her insurance would not cover it so we switched her to Protonix. Has had some dark lookingstools. Had her period twice last month. No change in bowels as far as diarrhea or constipaton.  She denies any other lumps or bumps. She is due for a mammogram and so she plans on scheduling that in the next month.   Review of Systems BP 141/89  Pulse 68  Ht 5\' 1"  (1.549 m)  Wt 198 lb (89.812 kg)  BMI 37.41 kg/m2  SpO2 97%    Allergies  Allergen Reactions  . Ace Inhibitors     REACTION: cough, also with ARBS  . Singulair Other (See Comments)    Headache     Past Medical History  Diagnosis Date  . Constipation   . Insomnia   . Eczema   . Depression   . Arthritis     neck  . Asthma     mild persistent  . SUI (stress urinary incontinence, female)   . Female bladder prolapse     No past surgical history on file.  History   Social History  . Marital Status: Married    Spouse Name: N/A    Number of Children: N/A  . Years of Education: N/A   Occupational History  . Not on file.   Social History Main Topics  . Smoking status: Never Smoker   . Smokeless tobacco: Not on file  . Alcohol Use: No  . Drug Use: No  . Sexually Active: Not on file   Other Topics Concern  . Not on file   Social History Narrative  . No narrative on file    Family History  Problem Relation Age of Onset  . Hypertension Father   . Hypertension Sister   . Hypertension Sister   . Cancer Other     breast- late 40's    Outpatient Encounter Prescriptions as of 10/02/2011  Medication Sig Dispense Refill  . buPROPion  (WELLBUTRIN XL) 150 MG 24 hr tablet Take 1 tablet (150 mg total) by mouth daily.  30 tablet  4  . fexofenadine-pseudoephedrine (ALLEGRA-D 24) 180-240 MG per 24 hr tablet Take 1 tablet by mouth daily.  30 tablet  2  . fluticasone (FLONASE) 50 MCG/ACT nasal spray Place 2 sprays into the nose daily.  1 g  4  . Fluticasone-Salmeterol (ADVAIR DISKUS) 500-50 MCG/DOSE AEPB Inhale 1 puff into the lungs 2 (two) times daily.  1 each  6  . metoprolol-hydrochlorothiazide (LOPRESSOR HCT) 50-25 MG per tablet take 1 tablet by mouth once daily  30 tablet  2  . pantoprazole (PROTONIX) 40 MG tablet Take 1 tablet (40 mg total) by mouth daily.  30 tablet  2  . PROAIR HFA 108 (90 BASE) MCG/ACT inhaler inhale 2 puffs by mouth every 6 hours if needed for wheezing  8.5 g  1  . zolpidem (AMBIEN) 5 MG tablet Take 1 tablet (5 mg total) by mouth at bedtime as needed for sleep.  30 tablet  1  . traMADol (ULTRAM) 50 MG tablet Take 1 tablet (  50 mg total) by mouth every 8 (eight) hours as needed for pain.  30 tablet  0  . traZODone (DESYREL) 50 MG tablet Take 1-2 tablets (50-100 mg total) by mouth at bedtime.  60 tablet  1  . DISCONTD: famotidine (PEPCID) 40 MG tablet Take 40 mg by mouth 2 (two) times daily. For acid reflux        . DISCONTD: omeprazole (PRILOSEC) 40 MG capsule take 1 capsule by mouth once daily  30 capsule  1           Objective:   Physical Exam  Constitutional: She is oriented to person, place, and time. She appears well-developed and well-nourished.  HENT:  Head: Normocephalic and atraumatic.  Right Ear: External ear normal.  Left Ear: External ear normal.  Nose: Nose normal.  Mouth/Throat: Oropharynx is clear and moist.       TMs and canals are clear.   Eyes: Conjunctivae and EOM are normal. Pupils are equal, round, and reactive to light.  Neck: Normal range of motion. Neck supple. No thyromegaly present.  Cardiovascular: Normal rate, regular rhythm and normal heart sounds.   Pulmonary/Chest:  Effort normal and breath sounds normal. She has no wheezes.  Abdominal: Soft. Bowel sounds are normal. She exhibits no distension and no mass. There is no tenderness. There is no rebound and no guarding.  Lymphadenopathy:    She has no cervical adenopathy.  Neurological: She is alert and oriented to person, place, and time.  Skin: Skin is warm and dry.  Psychiatric: She has a normal mood and affect.          Assessment & Plan:  Nausea- unclear etiology at this point. It could certainly be related to her headaches. She's also been taking some Goody's migraine-type headache pain reliever twice a day.  This because it also could be aggravating her reflux. We just changed her to protonix about a month ago. She feels it is helping her heartburn. Also consider that the Protonix itself could be causing her headaches and nausea. We will also check blood work to rule out any early pancreatitis or elevated liver enzymes it may also be causing nausea. I'm also concerned about a dark stools that she saw last weekend. Her last colonoscopy was in 2009. She has no family history of colon cancer. If her lab results are normal then I would like to send her to gastroenterology for further evaluation.  Headache-I will change her tramadol for pain reliever and asked her to get off the over-the-counter medications. At this point she most likely has a rebound-type headache.  Fatigue-she does have a history of B12 deficiency. We'll also check a CBC.  Hypertension-her blood pressure is slightly elevated today but was very well controlled the last 2 times she was here so I'm not going to adjust her medication today. I think that it is because she does not feel well.

## 2011-10-02 NOTE — Patient Instructions (Signed)
We will call you with your lab results. If you don't here from us in about a week then please give us a call at [phone].  

## 2011-10-03 ENCOUNTER — Other Ambulatory Visit: Payer: Self-pay | Admitting: Family Medicine

## 2011-10-03 DIAGNOSIS — R110 Nausea: Secondary | ICD-10-CM

## 2011-10-03 LAB — CBC WITH DIFFERENTIAL/PLATELET
HCT: 38.2 % (ref 36.0–46.0)
Hemoglobin: 12.4 g/dL (ref 12.0–15.0)
Lymphocytes Relative: 31 % (ref 12–46)
Lymphs Abs: 1.7 10*3/uL (ref 0.7–4.0)
MCHC: 32.5 g/dL (ref 30.0–36.0)
Monocytes Absolute: 0.3 10*3/uL (ref 0.1–1.0)
Monocytes Relative: 6 % (ref 3–12)
Neutro Abs: 3.3 10*3/uL (ref 1.7–7.7)
RBC: 4.55 MIL/uL (ref 3.87–5.11)
WBC: 5.5 10*3/uL (ref 4.0–10.5)

## 2011-10-16 ENCOUNTER — Telehealth: Payer: Self-pay | Admitting: *Deleted

## 2011-10-16 ENCOUNTER — Encounter: Payer: Self-pay | Admitting: Family Medicine

## 2011-10-16 ENCOUNTER — Ambulatory Visit: Payer: BC Managed Care – PPO | Admitting: Obstetrics & Gynecology

## 2011-10-16 ENCOUNTER — Ambulatory Visit (INDEPENDENT_AMBULATORY_CARE_PROVIDER_SITE_OTHER): Payer: BC Managed Care – PPO | Admitting: Family Medicine

## 2011-10-16 DIAGNOSIS — K219 Gastro-esophageal reflux disease without esophagitis: Secondary | ICD-10-CM

## 2011-10-16 DIAGNOSIS — G43109 Migraine with aura, not intractable, without status migrainosus: Secondary | ICD-10-CM

## 2011-10-16 DIAGNOSIS — R110 Nausea: Secondary | ICD-10-CM

## 2011-10-16 MED ORDER — SUMATRIPTAN SUCCINATE 100 MG PO TABS: 100.0000 mg | ORAL_TABLET | ORAL | Status: DC | PRN

## 2011-10-16 MED ORDER — PROPRANOLOL HCL 40 MG PO TABS: 40.0000 mg | ORAL_TABLET | Freq: Two times a day (BID) | ORAL | Status: DC

## 2011-10-16 MED ORDER — METOPROLOL TARTRATE 25 MG PO TABS: 25.0000 mg | ORAL_TABLET | Freq: Two times a day (BID) | ORAL | Status: DC

## 2011-10-16 NOTE — Telephone Encounter (Signed)
Pharmacy aware

## 2011-10-16 NOTE — Progress Notes (Signed)
  Subjective:    Patient ID: Carolyn [name], female    DOB: December 05, 1955, 56 y.o.   MRN: [id number]  HPI She says her HA and nausea is better overall. Has been off the Goodies powders.  Taking protonix and reallyhelps her.  Has GI appointment next week.  Stopped the wellbutrin.  Started to use the trazodone.  Still hs a lot of pain in her neck. HA are 50% better. occ uses a stool softener. No blood in the stool or urine. Has been drinking soda lately.  HA ove rthe eye. GEtting HA about 3 days per week. Occ get light and sound sensivitity.  Occ get nausea. She is currently using Tylenol for her headaches. He does help some.   Review of Systems     Objective:   Physical Exam  Constitutional: She is oriented to person, place, and time. She appears well-developed and well-nourished.  HENT:  Head: Normocephalic and atraumatic.  Cardiovascular: Normal rate, regular rhythm and normal heart sounds.   Pulmonary/Chest: Effort normal and breath sounds normal.  Abdominal: Soft. Bowel sounds are normal. She exhibits no distension and no mass. There is no tenderness. There is no rebound and no guarding.  Neurological: She is alert and oriented to person, place, and time.  Skin: Skin is warm and dry.  Psychiatric: She has a normal mood and affect. Her behavior is normal.          Assessment & Plan:  Migraine - we discussed that we need to work on getting these under better control. She is currently getting about 3 headaches per week. We will start propranolol twice a day 40 mg and I will see her back in 6 weeks. Hopefully we can get her much better control. Also gave her prescription for Imitrex to use for rescue medication when she actually gets a headache. When he decided to obtain that works well for her. She is much better today than she was off all her a few weeks ago.  GERD/stomach Pain  - Continue PPI. She is improving. Keep apt with GI. Avoid sodas that she has recently started drinking. Still  avoid the goodies poweders.

## 2011-10-16 NOTE — Telephone Encounter (Signed)
OK, will switch to metoprolol. Cancel the propranolol. I sent new rx.

## 2011-10-16 NOTE — Telephone Encounter (Signed)
Pharmacy is calling to make you aware that the pt is not on any asthma meds at this time and that the Propanolol can aggravate her asthma to where the Toprol won't. They just want to make sure you still want her on the Propanolol? Please advise.

## 2011-10-27 ENCOUNTER — Other Ambulatory Visit: Payer: Self-pay | Admitting: Family Medicine

## 2011-11-13 ENCOUNTER — Other Ambulatory Visit: Payer: Self-pay | Admitting: *Deleted

## 2011-11-13 MED ORDER — PROPRANOLOL HCL 20 MG PO TABS: 20.0000 mg | ORAL_TABLET | Freq: Two times a day (BID) | ORAL | Status: DC

## 2011-11-15 ENCOUNTER — Other Ambulatory Visit: Payer: Self-pay | Admitting: *Deleted

## 2011-11-15 MED ORDER — SUMATRIPTAN SUCCINATE 100 MG PO TABS: 100.0000 mg | ORAL_TABLET | ORAL | Status: DC | PRN

## 2011-11-16 ENCOUNTER — Telehealth: Payer: Self-pay | Admitting: *Deleted

## 2011-11-16 MED ORDER — FLUTICASONE-SALMETEROL 250-50 MCG/DOSE IN AEPB: 1.0000 | INHALATION_SPRAY | Freq: Two times a day (BID) | RESPIRATORY_TRACT | Status: DC

## 2011-11-16 NOTE — Telephone Encounter (Signed)
LM on VM and placed coupon paper up front for pt to pick up.

## 2011-11-16 NOTE — Telephone Encounter (Signed)
OK to switch back.  New rx sent. Also she can check online for coupon care, or we may have some.

## 2011-11-16 NOTE — Telephone Encounter (Signed)
Pt is asking if we can switch her back to the 250/50 advair disk b/c she states the high dose one is costing to much. Please advise.

## 2011-11-27 ENCOUNTER — Ambulatory Visit: Payer: BC Managed Care – PPO | Admitting: Family Medicine

## 2011-11-27 DIAGNOSIS — Z0289 Encounter for other administrative examinations: Secondary | ICD-10-CM

## 2011-12-03 ENCOUNTER — Telehealth: Payer: Self-pay | Admitting: *Deleted

## 2011-12-03 NOTE — Telephone Encounter (Signed)
Propranolol d/c'd due to med change in April 2013.

## 2011-12-08 ENCOUNTER — Other Ambulatory Visit: Payer: Self-pay | Admitting: Family Medicine

## 2011-12-18 ENCOUNTER — Other Ambulatory Visit: Payer: Self-pay | Admitting: Family Medicine

## 2012-01-03 ENCOUNTER — Other Ambulatory Visit: Payer: Self-pay | Admitting: Family Medicine

## 2012-01-08 ENCOUNTER — Telehealth: Payer: Self-pay | Admitting: Family Medicine

## 2012-01-08 NOTE — Telephone Encounter (Signed)
Please call patient. Normal mammogram.  Repeat in 1 year.  

## 2012-01-08 NOTE — Telephone Encounter (Signed)
Left message on vm with results  

## 2012-02-07 ENCOUNTER — Other Ambulatory Visit: Payer: Self-pay | Admitting: Family Medicine

## 2012-03-03 ENCOUNTER — Other Ambulatory Visit: Payer: Self-pay | Admitting: Family Medicine

## 2012-03-11 ENCOUNTER — Other Ambulatory Visit: Payer: Self-pay | Admitting: Family Medicine

## 2012-03-12 ENCOUNTER — Other Ambulatory Visit: Payer: Self-pay | Admitting: Family Medicine

## 2012-03-21 ENCOUNTER — Other Ambulatory Visit: Payer: Self-pay | Admitting: Family Medicine

## 2012-03-29 ENCOUNTER — Other Ambulatory Visit: Payer: Self-pay | Admitting: Family Medicine

## 2012-03-31 ENCOUNTER — Other Ambulatory Visit: Payer: Self-pay | Admitting: *Deleted

## 2012-03-31 MED ORDER — ALBUTEROL SULFATE HFA 108 (90 BASE) MCG/ACT IN AERS: 2.0000 | INHALATION_SPRAY | Freq: Four times a day (QID) | RESPIRATORY_TRACT | Status: DC | PRN

## 2012-04-16 ENCOUNTER — Ambulatory Visit (INDEPENDENT_AMBULATORY_CARE_PROVIDER_SITE_OTHER): Payer: 59 | Admitting: Family Medicine

## 2012-04-16 ENCOUNTER — Encounter: Payer: Self-pay | Admitting: Family Medicine

## 2012-04-16 VITALS — BP 135/83 | HR 68 | Ht 61.0 in | Wt 192.0 lb

## 2012-04-16 DIAGNOSIS — N83209 Unspecified ovarian cyst, unspecified side: Secondary | ICD-10-CM

## 2012-04-16 DIAGNOSIS — J45909 Unspecified asthma, uncomplicated: Secondary | ICD-10-CM

## 2012-04-16 DIAGNOSIS — I10 Essential (primary) hypertension: Secondary | ICD-10-CM

## 2012-04-16 DIAGNOSIS — Z23 Encounter for immunization: Secondary | ICD-10-CM

## 2012-04-16 DIAGNOSIS — G479 Sleep disorder, unspecified: Secondary | ICD-10-CM

## 2012-04-16 DIAGNOSIS — G43909 Migraine, unspecified, not intractable, without status migrainosus: Secondary | ICD-10-CM

## 2012-04-16 LAB — COMPLETE METABOLIC PANEL WITH GFR
ALT: 10 U/L (ref 0–35)
Albumin: 3.9 g/dL (ref 3.5–5.2)
CO2: 24 mEq/L (ref 19–32)
Calcium: 9.3 mg/dL (ref 8.4–10.5)
Chloride: 105 mEq/L (ref 96–112)
Creat: 0.93 mg/dL (ref 0.50–1.10)
GFR, Est African American: 81 mL/min
Potassium: 4.1 mEq/L (ref 3.5–5.3)
Total Protein: 6.9 g/dL (ref 6.0–8.3)

## 2012-04-16 LAB — LIPID PANEL
LDL Cholesterol: 123 mg/dL — ABNORMAL HIGH (ref 0–99)
Triglycerides: 97 mg/dL (ref <150)

## 2012-04-16 MED ORDER — METOPROLOL TARTRATE 25 MG PO TABS: 25.0000 mg | ORAL_TABLET | Freq: Two times a day (BID) | ORAL | Status: DC

## 2012-04-16 MED ORDER — TRAZODONE HCL 50 MG PO TABS: 50.0000 mg | ORAL_TABLET | Freq: Every day | ORAL | Status: DC

## 2012-04-16 NOTE — Progress Notes (Addendum)
  Subjective:    Patient ID: Carolyn [name], female    DOB: Jan 31, 1956, 56 y.o.   MRN: [id number]  HPI Carolyn [name] to the ED on 03/27/12 for a right ovarian cyst. Has app ton 04/21/12 with gyn for further evaluation. WAs seen sat [place] [facility].  Says does have pain there right before her periods.  Started her period on 03/31/12 and says does feel but but a little sore. She missed work from 10/10-10/21.  Returned to work on the 04/08/12.  Says sometimes misses work for asthma and migraines.  Happens maybe once every 4 months.  Will miss 1-2 days at a time.   Insomnia-Says the trazodone is not helping her sleep.  She says it does help some but not as much as she would like. Ambien didn't work well at all.  No caffeine after lunch. Same bedtime, around 10 PM.  Wakes up about the same time as well. She feels like her sleep environment as conducive to sleep. She has not tried 2 tabs of trazodone yet.    Review of Systems     Objective:   Physical Exam  Constitutional: She is oriented to person, place, and time. She appears well-developed and well-nourished.  HENT:  Head: Normocephalic and atraumatic.  Neck: Neck supple. No thyromegaly present.  Cardiovascular: Normal rate, regular rhythm and normal heart sounds.   Pulmonary/Chest: Effort normal and breath sounds normal.  Abdominal: Soft. Bowel sounds are normal. She exhibits no distension and no mass. There is no tenderness. There is no rebound and no guarding.  Musculoskeletal: She exhibits no edema.  Lymphadenopathy:    She has no cervical adenopathy.  Neurological: She is alert and oriented to person, place, and time.  Skin: Skin is warm and dry.  Psychiatric: She has a normal mood and affect. Her behavior is normal.          Assessment & Plan:  Ovarian cyst - Needs FMLA paperwork completed for work. She is much better and has f/u with GYN next week. They can discuss treatment options.  Insomnia - try increase the trazodone to 2 tabs first and  see if helping. If not can set her a difference today besides Ambien since it did not work well.  Migraines-she does have to miss work for a day every 3-4 months for her migraines. She did have a migraine last week which she felt was triggered by the weather change.  Asthma-she feels like it's well controlled right now. Flu vaccine given today.  Hypertension-well-controlled. She does need refills sent to pharmacy. No chest pain or shortness of breath.   Addendum-I. did receive the notes from [facility]. She did have a cystic 3 cm right adnexal lesion with a small amount of adjacent fluid. They did recommend followup pelvic ultrasound. She does have an appointment with GYN next week.

## 2012-04-18 ENCOUNTER — Other Ambulatory Visit: Payer: Self-pay | Admitting: Family Medicine

## 2012-04-20 DIAGNOSIS — Z0289 Encounter for other administrative examinations: Secondary | ICD-10-CM

## 2012-05-09 ENCOUNTER — Other Ambulatory Visit: Payer: Self-pay | Admitting: Family Medicine

## 2012-05-12 ENCOUNTER — Telehealth: Payer: Self-pay | Admitting: *Deleted

## 2012-05-12 MED ORDER — BUDESONIDE-FORMOTEROL FUMARATE 160-4.5 MCG/ACT IN AERO: 2.0000 | INHALATION_SPRAY | Freq: Two times a day (BID) | RESPIRATORY_TRACT | Status: DC

## 2012-05-12 NOTE — Telephone Encounter (Signed)
Will cyhange to symbicort. New rx sent.

## 2012-05-12 NOTE — Telephone Encounter (Signed)
Pt calls and states that the Advair 250/50mg  is too expensive cost is 280.00 her cost. Wants to know if there is anything else you can send to pharmacy that is cheaper and comparable to it

## 2012-05-12 NOTE — Telephone Encounter (Signed)
Pt given sample #1 and notified to pick up at office with coupon card

## 2012-05-12 NOTE — Telephone Encounter (Signed)
We have Symbicort 80/4.5mg  sampl and coupon card available

## 2012-05-12 NOTE — Telephone Encounter (Signed)
Lets see if we have any coupon cards for Advair, Symbocort or Dulera.  Which one has the best coupon cards we can send and then she can activitate the cared and see if better covered.

## 2012-05-21 ENCOUNTER — Other Ambulatory Visit: Payer: Self-pay | Admitting: Family Medicine

## 2012-06-12 ENCOUNTER — Other Ambulatory Visit: Payer: Self-pay | Admitting: Family Medicine

## 2012-06-23 ENCOUNTER — Other Ambulatory Visit: Payer: Self-pay | Admitting: Family Medicine

## 2012-07-28 ENCOUNTER — Encounter: Payer: Self-pay | Admitting: Family Medicine

## 2012-07-28 ENCOUNTER — Ambulatory Visit (INDEPENDENT_AMBULATORY_CARE_PROVIDER_SITE_OTHER): Payer: 59 | Admitting: Family Medicine

## 2012-07-28 VITALS — BP 139/81 | HR 50 | Wt 192.0 lb

## 2012-07-28 DIAGNOSIS — J45909 Unspecified asthma, uncomplicated: Secondary | ICD-10-CM

## 2012-07-28 DIAGNOSIS — K219 Gastro-esophageal reflux disease without esophagitis: Secondary | ICD-10-CM

## 2012-07-28 DIAGNOSIS — I10 Essential (primary) hypertension: Secondary | ICD-10-CM

## 2012-07-28 DIAGNOSIS — F329 Major depressive disorder, single episode, unspecified: Secondary | ICD-10-CM

## 2012-07-28 DIAGNOSIS — F32A Depression, unspecified: Secondary | ICD-10-CM

## 2012-07-28 DIAGNOSIS — G43909 Migraine, unspecified, not intractable, without status migrainosus: Secondary | ICD-10-CM

## 2012-07-28 DIAGNOSIS — G4700 Insomnia, unspecified: Secondary | ICD-10-CM

## 2012-07-28 MED ORDER — FLUOXETINE HCL (PMDD) 10 MG PO CAPS: ORAL_CAPSULE | ORAL | Status: DC

## 2012-07-28 MED ORDER — MOMETASONE FURO-FORMOTEROL FUM 200-5 MCG/ACT IN AERO: 2.0000 | INHALATION_SPRAY | Freq: Two times a day (BID) | RESPIRATORY_TRACT | Status: DC

## 2012-07-28 MED ORDER — TOPIRAMATE 25 MG PO TABS: ORAL_TABLET | ORAL | Status: DC

## 2012-07-28 NOTE — Addendum Note (Signed)
Addended by: Deno Etienne on: 07/28/2012 05:04 PM   Modules accepted: Orders

## 2012-07-28 NOTE — Progress Notes (Signed)
  Subjective:    Patient ID: Carolyn [name], female    DOB: 12/04/1955, 57 y.o.   MRN: [id number]  HPI HTN -  Pt denies chest pain, SOB, dizziness, or heart palpitations.  Taking meds as directed w/o problems.  Denies medication side effects.  Has been eating more exercise than usual.  No exercise right now.    Insmonia - Waking up at 4AM dreading her next day.  Already taking trazodone for sleep. Thinks it is effecting her job.  Has been feeling down and sad.    Asthma - Having some cp after using her symbicort.  No recent exacerbations. Back in November she called because her insurance is no longer covering Advair so was given her samples of Symbicort. She feels like it does work well but she's been getting a little bit of chest discomfort right after she uses the inhaler and wonders if that could be the medication.  Migraines-she says her headaches have been getting worse of the last month or 2. She's been flying through her rescue medication and doesn't want to come become dependent on the over-the-counter headache medications again. She met she has not been sleeping as well. She's been very anxious about her job. She thinks her stress certainly could be contributing. She is at the point of wanting something prophylactic for her headaches.  Review of Systems     Objective:   Physical Exam  Constitutional: She is oriented to person, place, and time. She appears well-developed and well-nourished.  HENT:  Head: Normocephalic and atraumatic.  Cardiovascular: Normal rate, regular rhythm and normal heart sounds.   Pulmonary/Chest: Effort normal and breath sounds normal.  Neurological: She is alert and oriented to person, place, and time.  Skin: Skin is warm and dry.  Psychiatric: She has a normal mood and affect. Her behavior is normal.          Assessment & Plan:  HTN - Well controlled.  Work on low salt diet and exercise.  F/U in 6 weeks.    Insmonia-Still using trazodone. I think  her recent mood change has been worsening her insomnia.   Depression - Discussed options.  Counseling vs medications. She will look into EAP at work.  Will start fluoxetine and f/U in 4 weeks. Discussed potential side effects. Hopefully we can improve her mood, this will improve her sleep, and hopefully this will help her headaches as well.  Migraine currently uncontrolled. We discussed different options including just working on her depression insomnia or actually starting a medication is Zetia. For migraines. She opted for the second choice. We'll start Topamax at 25 mg twice a day. Increase to 50 mg twice a day after one week. Followup in one month to see how well she's doing on the medication and to adjust the dose if needed. Continue to use rescue medication sparingly.   Asthma-will try samples of Dulera for 2 weeks to see if she feels like the chest discomfort resolves. It is possible it could be coming from the Symbicort but if it persists then I would like to work it up further. I doubt this is reflux related as she or he takes pantoprazole 40 mg daily. Make sure following the reflux diet.

## 2012-08-13 ENCOUNTER — Other Ambulatory Visit: Payer: Self-pay | Admitting: Family Medicine

## 2012-08-18 ENCOUNTER — Other Ambulatory Visit: Payer: Self-pay | Admitting: Family Medicine

## 2012-08-25 ENCOUNTER — Ambulatory Visit: Payer: 59 | Admitting: Family Medicine

## 2012-09-22 ENCOUNTER — Ambulatory Visit (INDEPENDENT_AMBULATORY_CARE_PROVIDER_SITE_OTHER): Payer: 59 | Admitting: Family Medicine

## 2012-09-22 ENCOUNTER — Ambulatory Visit (INDEPENDENT_AMBULATORY_CARE_PROVIDER_SITE_OTHER): Payer: 59

## 2012-09-22 ENCOUNTER — Encounter: Payer: Self-pay | Admitting: Family Medicine

## 2012-09-22 VITALS — BP 125/68 | HR 71 | Wt 189.0 lb

## 2012-09-22 DIAGNOSIS — R5381 Other malaise: Secondary | ICD-10-CM

## 2012-09-22 DIAGNOSIS — G43909 Migraine, unspecified, not intractable, without status migrainosus: Secondary | ICD-10-CM

## 2012-09-22 DIAGNOSIS — R5383 Other fatigue: Secondary | ICD-10-CM

## 2012-09-22 DIAGNOSIS — R059 Cough, unspecified: Secondary | ICD-10-CM

## 2012-09-22 DIAGNOSIS — R05 Cough: Secondary | ICD-10-CM

## 2012-09-22 DIAGNOSIS — F32A Depression, unspecified: Secondary | ICD-10-CM

## 2012-09-22 DIAGNOSIS — F329 Major depressive disorder, single episode, unspecified: Secondary | ICD-10-CM

## 2012-09-22 LAB — CBC
HCT: 37.6 % (ref 36.0–46.0)
Hemoglobin: 12.3 g/dL (ref 12.0–15.0)
MCH: 26.5 pg (ref 26.0–34.0)
MCHC: 32.7 g/dL (ref 30.0–36.0)
MCV: 81 fL (ref 78.0–100.0)
RBC: 4.64 MIL/uL (ref 3.87–5.11)

## 2012-09-22 MED ORDER — MOMETASONE FURO-FORMOTEROL FUM 200-5 MCG/ACT IN AERO: 2.0000 | INHALATION_SPRAY | Freq: Two times a day (BID) | RESPIRATORY_TRACT | Status: DC

## 2012-09-22 MED ORDER — HYDROCODONE-HOMATROPINE 5-1.5 MG/5ML PO SYRP: 5.0000 mL | ORAL_SOLUTION | Freq: Every evening | ORAL | Status: DC | PRN

## 2012-09-22 MED ORDER — FLUOXETINE HCL 20 MG PO CAPS: 20.0000 mg | ORAL_CAPSULE | Freq: Every day | ORAL | Status: DC

## 2012-09-22 MED ORDER — TOPIRAMATE 100 MG PO TABS: 100.0000 mg | ORAL_TABLET | Freq: Two times a day (BID) | ORAL | Status: DC

## 2012-09-22 NOTE — Progress Notes (Signed)
  Subjective:    Patient ID: Carolyn [name], female    DOB: 1955/08/11, 57 y.o.   MRN: [id number]  HPI Cough x 1 months.  Cough is dry mostly but occ productive sputm, green. Occ fever.  No SOB. Worse at night.  She is using the [facility]. No sinus pain or pressure. Hs been rinsing after the dulera, which she uses for her asthma. No heartburn or reflux symptoms.  She also wants and if she can have time off of work to help take care of her daughter. Her daughter is having surgery on April 17th for knee replacement, and she needs to be out to help her.  Her daughter has juvenile arthritis.  I Will need to be out 6 weeks.  She has been really stressed out.  Has felt down.  Her work stats are down. Sh ehas been really fatigued.    Migraine - like her headaches are not well controlled. She was taking Topamax 50 mg twice a day. She says she wants to go back to just the as needed medication. She's been under a lot of stress and has not been sleeping well. She does use trazodone at bedtime.  Depression-she's taking fluoxetine 10 mg. She was supposed to increase to 2 tabs after week but says she just decided to stay at the 10 mg. She's not sure if it's really helping her but she has not had any negative side effects on the medication.  Review of Systems     Objective:   Physical Exam  Constitutional: She is oriented to person, place, and time. She appears well-developed and well-nourished.  HENT:  Head: Normocephalic and atraumatic.  Right Ear: External ear normal.  Left Ear: External ear normal.  Nose: Nose normal.  Mouth/Throat: Oropharynx is clear and moist.  TMs and canals are clear.   Eyes: Conjunctivae and EOM are normal. Pupils are equal, round, and reactive to light.  Neck: Neck supple. No thyromegaly present.  Cardiovascular: Normal rate, regular rhythm and normal heart sounds.   Pulmonary/Chest: Effort normal and breath sounds normal. She has no wheezes.  Lymphadenopathy:    She has no  cervical adenopathy.  Neurological: She is alert and oriented to person, place, and time.  Skin: Skin is warm and dry.  Psychiatric: She has a normal mood and affect.          Assessment & Plan:  Cough  -unclear etiology. She is not on any ACE inhibitor. She denies any reflux type symptoms. Using her dulera regularly.  Will get CXR since unexplained cough x 1 mo.  Never a smoker.  Also consider round of steroids if she's not improving. Given prescription cough medicine. Her lung exam sounds great today.  Depressoin - uncontrolled. Increase fluoxetine to 20mg  dialy. F/u in 6 weeks. Refer for cousneling.   Migraine - not well controlled. I discussed the importance of taking a prophylactic medication so that she doesn't have to use her rescue medication frequently. She can certainly use the rescue medication as needed on top of the Topamax if needed. Increase topamax to 100mg  BID.  F/U in 6 weeks.   Will need FMLA to help take care of her daughter he is getting a knee replacement for rheumatoid arthritis. She will need to be out for 6 weeks. The surgery scheduled for April 17 at this point in time. She will drop off the FMLA paper work.

## 2012-09-23 ENCOUNTER — Telehealth: Payer: Self-pay | Admitting: *Deleted

## 2012-09-23 NOTE — Telephone Encounter (Signed)
Error

## 2012-10-10 ENCOUNTER — Other Ambulatory Visit: Payer: Self-pay | Admitting: Family Medicine

## 2012-11-03 ENCOUNTER — Ambulatory Visit: Payer: 59 | Admitting: Family Medicine

## 2012-11-03 DIAGNOSIS — Z0289 Encounter for other administrative examinations: Secondary | ICD-10-CM

## 2012-11-10 ENCOUNTER — Other Ambulatory Visit: Payer: Self-pay | Admitting: Family Medicine

## 2012-11-11 ENCOUNTER — Other Ambulatory Visit: Payer: Self-pay | Admitting: Family Medicine

## 2012-11-28 ENCOUNTER — Ambulatory Visit (INDEPENDENT_AMBULATORY_CARE_PROVIDER_SITE_OTHER): Payer: 59 | Admitting: Physician Assistant

## 2012-11-28 ENCOUNTER — Encounter: Payer: Self-pay | Admitting: Physician Assistant

## 2012-11-28 VITALS — BP 109/67 | HR 56 | Wt 185.0 lb

## 2012-11-28 DIAGNOSIS — R05 Cough: Secondary | ICD-10-CM

## 2012-11-28 DIAGNOSIS — J45901 Unspecified asthma with (acute) exacerbation: Secondary | ICD-10-CM

## 2012-11-28 DIAGNOSIS — D649 Anemia, unspecified: Secondary | ICD-10-CM

## 2012-11-28 DIAGNOSIS — R059 Cough, unspecified: Secondary | ICD-10-CM

## 2012-11-28 DIAGNOSIS — J4541 Moderate persistent asthma with (acute) exacerbation: Secondary | ICD-10-CM

## 2012-11-28 MED ORDER — MONTELUKAST SODIUM 5 MG PO CHEW: 5.0000 mg | CHEWABLE_TABLET | Freq: Every day | ORAL | Status: DC

## 2012-11-28 MED ORDER — HYDROCODONE-HOMATROPINE 5-1.5 MG/5ML PO SYRP: 5.0000 mL | ORAL_SOLUTION | Freq: Every evening | ORAL | Status: DC | PRN

## 2012-11-28 MED ORDER — PREDNISONE 50 MG PO TABS: ORAL_TABLET | ORAL | Status: DC

## 2012-11-28 NOTE — Patient Instructions (Addendum)
Start low dose singulair and see if you are able to tolerate taking at bedtime.  Continue on Dulera 2 puffs twice a day.  Will give prednisone for 5 days and hycodan cough syrup to use at night.   OTC iron 325 twice a day follow up for recheck.   Follow up with Dr. Linford Arnold in 6 weeks.

## 2012-11-28 NOTE — Progress Notes (Signed)
  Subjective:    Patient ID: Carolyn [name], female    DOB: 1956/03/30, 57 y.o.   MRN: [id number]  HPI Patient presents to the clinic with her daughter for ongoing cough/wheezing and fatigue.   Pt was told she had low iron stores but has not started iron yet. She continues to be more and more fatigued. Labs were done at last visit and TSH and B12 looked great.   For the last 3 months she has coughed daily. Cough is worse at night and in the morning. She is using her albuterol inhaler twice a day on average and does help some. Cough is dry most of the time but sometimes productive. Denies any fever, chills, ear pain, ST, sinus pressure. She takes Dulera 2 puffs BID per patient.    Review of Systems     Objective:   Physical Exam  Constitutional: She is oriented to person, place, and time. She appears well-developed and well-nourished.  HENT:  Head: Normocephalic and atraumatic.  Right Ear: External ear normal.  Left Ear: External ear normal.  Nose: Nose normal.  Mouth/Throat: Oropharynx is clear and moist.  Eyes: Conjunctivae are normal.  Neck: Normal range of motion. Neck supple.  Cardiovascular: Normal rate, regular rhythm and normal heart sounds.   Pulmonary/Chest: Effort normal.  Coarse breath sounds. Wheezing heard at base.   Lymphadenopathy:    She has no cervical adenopathy.  Neurological: She is alert and oriented to person, place, and time.  Skin: Skin is warm and dry.  Psychiatric: She has a normal mood and affect. Her behavior is normal.          Assessment & Plan:  Asthma exacerbation/anemia- I do not see any meds that could be causing cough. Not on an ACE inhibitor. Peak flows 2 were in yellow but one was green. Gave prednisone for 5 days. Restarted on singulair. Pt has a hx of headache with singulair. We are starting at half the dose and seeing if helps control symptoms and no headache occurs. Continue Dulera 2 puffs BID. Use albuterol inhaler as needed. Discussed  with patient not considered controlled until using no more than once a week and still feeling good. Did give cough syrup to use at night. She is anemic and since did not start medication suspect tired from that. Encouraged pt to start iron OTC 325 twice a day. Follow up with Dr. Linford Arnold in 6 weeks.

## 2012-12-16 ENCOUNTER — Other Ambulatory Visit: Payer: Self-pay | Admitting: Family Medicine

## 2012-12-23 ENCOUNTER — Other Ambulatory Visit: Payer: Self-pay | Admitting: Family Medicine

## 2013-01-09 ENCOUNTER — Ambulatory Visit: Payer: 59 | Admitting: Family Medicine

## 2013-01-09 DIAGNOSIS — Z0289 Encounter for other administrative examinations: Secondary | ICD-10-CM

## 2013-04-28 ENCOUNTER — Other Ambulatory Visit: Payer: Self-pay | Admitting: Family Medicine

## 2013-06-29 ENCOUNTER — Other Ambulatory Visit: Payer: Self-pay | Admitting: Family Medicine

## 2013-08-28 ENCOUNTER — Other Ambulatory Visit: Payer: Self-pay | Admitting: Family Medicine

## 2013-09-22 ENCOUNTER — Other Ambulatory Visit: Payer: Self-pay | Admitting: Family Medicine

## 2013-10-17 ENCOUNTER — Other Ambulatory Visit: Payer: Self-pay | Admitting: Family Medicine

## 2013-10-30 ENCOUNTER — Ambulatory Visit (INDEPENDENT_AMBULATORY_CARE_PROVIDER_SITE_OTHER): Payer: BC Managed Care – PPO | Admitting: Family Medicine

## 2013-10-30 ENCOUNTER — Encounter: Payer: Self-pay | Admitting: Family Medicine

## 2013-10-30 VITALS — BP 120/76 | HR 46 | Wt 182.0 lb

## 2013-10-30 DIAGNOSIS — J45909 Unspecified asthma, uncomplicated: Secondary | ICD-10-CM

## 2013-10-30 DIAGNOSIS — I10 Essential (primary) hypertension: Secondary | ICD-10-CM

## 2013-10-30 DIAGNOSIS — R7989 Other specified abnormal findings of blood chemistry: Secondary | ICD-10-CM

## 2013-10-30 DIAGNOSIS — R790 Abnormal level of blood mineral: Secondary | ICD-10-CM

## 2013-10-30 MED ORDER — MOMETASONE FURO-FORMOTEROL FUM 200-5 MCG/ACT IN AERO: 2.0000 | INHALATION_SPRAY | Freq: Two times a day (BID) | RESPIRATORY_TRACT | Status: DC

## 2013-10-30 MED ORDER — TRAZODONE HCL 50 MG PO TABS: ORAL_TABLET | ORAL | Status: DC

## 2013-10-30 MED ORDER — PANTOPRAZOLE SODIUM 40 MG PO TBEC: DELAYED_RELEASE_TABLET | ORAL | Status: DC

## 2013-10-30 MED ORDER — METOPROLOL-HYDROCHLOROTHIAZIDE 50-25 MG PO TABS: ORAL_TABLET | ORAL | Status: DC

## 2013-10-30 MED ORDER — SUMATRIPTAN SUCCINATE 100 MG PO TABS: ORAL_TABLET | ORAL | Status: DC

## 2013-10-30 NOTE — Progress Notes (Signed)
   Subjective:    Patient ID: Carolyn [name], female    DOB: June 23, 1955, 58 y.o.   MRN: [id number]  HPI Hypertension- Pt denies chest pain, SOB, dizziness, or heart palpitations.  Taking meds as directed w/o problems.  Denies medication side effects.    Hx of low iron stores. Taking iron pill a couple of times a week. She doesn't she doesn't take it daily because it causes constipation.  Asthma - dong well this spring.  Doing well on dulera.  Rarely uses her proair.   Review of Systems     Objective:   Physical Exam  Constitutional: She is oriented to person, place, and time. She appears well-developed and well-nourished.  HENT:  Head: Normocephalic and atraumatic.  Cardiovascular: Normal rate, regular rhythm and normal heart sounds.   Pulmonary/Chest: Effort normal and breath sounds normal.  Neurological: She is alert and oriented to person, place, and time.  Skin: Skin is warm and dry.  Psychiatric: She has a normal mood and affect. Her behavior is normal.          Assessment & Plan:  HTN- well controlled.  F/U in 6 months. Due for CMP and lipids.   Iron def - encouraged to take her iron consistently. May need to use a stool soft with this and she does get some constipation with it.  Asthma-well controlled on current regimen. Refill sent to pharmacy.  Encouraged her to schedule a physical sometime this summer.

## 2013-10-31 LAB — COMPLETE METABOLIC PANEL WITH GFR
ALBUMIN: 4 g/dL (ref 3.5–5.2)
ALK PHOS: 38 U/L — AB (ref 39–117)
ALT: 9 U/L (ref 0–35)
AST: 14 U/L (ref 0–37)
BUN: 13 mg/dL (ref 6–23)
CO2: 27 mEq/L (ref 19–32)
Calcium: 9.3 mg/dL (ref 8.4–10.5)
Chloride: 102 mEq/L (ref 96–112)
Creat: 0.88 mg/dL (ref 0.50–1.10)
GFR, Est African American: 85 mL/min
GFR, Est Non African American: 74 mL/min
Glucose, Bld: 81 mg/dL (ref 70–99)
POTASSIUM: 3.9 meq/L (ref 3.5–5.3)
SODIUM: 137 meq/L (ref 135–145)
Total Bilirubin: 0.5 mg/dL (ref 0.2–1.2)
Total Protein: 7 g/dL (ref 6.0–8.3)

## 2013-10-31 LAB — LIPID PANEL
CHOL/HDL RATIO: 4.6 ratio
CHOLESTEROL: 182 mg/dL (ref 0–200)
HDL: 40 mg/dL (ref >39)
LDL Cholesterol: 119 mg/dL — ABNORMAL HIGH (ref 0–99)
Triglycerides: 113 mg/dL (ref <150)
VLDL: 23 mg/dL (ref 0–40)

## 2013-10-31 LAB — TSH: TSH: 0.364 u[IU]/mL (ref 0.350–4.500)

## 2013-10-31 LAB — FERRITIN: FERRITIN: 9 ng/mL — AB (ref 10–291)

## 2013-11-04 ENCOUNTER — Telehealth: Payer: Self-pay | Admitting: Family Medicine

## 2013-11-04 MED ORDER — FLUTICASONE FUROATE-VILANTEROL 100-25 MCG/INH IN AEPB: 1.0000 | INHALATION_SPRAY | Freq: Every day | RESPIRATORY_TRACT | Status: DC

## 2013-11-04 NOTE — Telephone Encounter (Signed)
Pt is ok with the Breo being sent to the pharmacy.  Meyer CoryMisty Ahmad, LPN

## 2013-11-04 NOTE — Telephone Encounter (Signed)
rx sent

## 2013-11-04 NOTE — Telephone Encounter (Signed)
Call pt: Her insurance wont cover dulera. They will cover Breo ellipita which is a once a day inhaler.  Let me know if ok to sent to the pharmacy

## 2013-12-01 ENCOUNTER — Other Ambulatory Visit: Payer: Self-pay | Admitting: *Deleted

## 2013-12-01 MED ORDER — METOPROLOL-HYDROCHLOROTHIAZIDE 50-25 MG PO TABS: ORAL_TABLET | ORAL | Status: DC

## 2013-12-01 MED ORDER — TRAZODONE HCL 50 MG PO TABS: ORAL_TABLET | ORAL | Status: DC

## 2013-12-01 MED ORDER — SUMATRIPTAN SUCCINATE 100 MG PO TABS: ORAL_TABLET | ORAL | Status: DC

## 2013-12-01 MED ORDER — PANTOPRAZOLE SODIUM 40 MG PO TBEC: DELAYED_RELEASE_TABLET | ORAL | Status: DC

## 2014-06-04 ENCOUNTER — Other Ambulatory Visit: Payer: Self-pay | Admitting: Physician Assistant

## 2014-06-04 ENCOUNTER — Ambulatory Visit (INDEPENDENT_AMBULATORY_CARE_PROVIDER_SITE_OTHER): Payer: BC Managed Care – PPO | Admitting: Physician Assistant

## 2014-06-04 VITALS — BP 133/75 | HR 53 | Temp 97.8°F | Wt 185.0 lb

## 2014-06-04 DIAGNOSIS — R300 Dysuria: Secondary | ICD-10-CM

## 2014-06-04 LAB — WET PREP FOR TRICH, YEAST, CLUE
Clue Cells Wet Prep HPF POC: NONE SEEN
Trich, Wet Prep: NONE SEEN
YEAST WET PREP: NONE SEEN

## 2014-06-04 LAB — POCT URINALYSIS DIPSTICK
Bilirubin, UA: NEGATIVE
Blood, UA: NEGATIVE
Glucose, UA: NEGATIVE
KETONES UA: NEGATIVE
Nitrite, UA: NEGATIVE
PH UA: 5.5
PROTEIN UA: NEGATIVE
Spec Grav, UA: 1.02
UROBILINOGEN UA: 0.2

## 2014-06-04 MED ORDER — NITROFURANTOIN MONOHYD MACRO 100 MG PO CAPS: 100.0000 mg | ORAL_CAPSULE | Freq: Two times a day (BID) | ORAL | Status: DC

## 2014-06-04 NOTE — Progress Notes (Signed)
   Subjective:    Patient ID: Carolyn [name], female    DOB: 1955/12/18, 58 y.o.   MRN: [id number]  HPI  Carolyn [name] complains of slight back pain, sweats and burning after she urinates for 2 weeks. She does report frequent urination. Denies pelvic pain, vaginal discharge or itching, fever or chills.   Review of Systems     Objective:   Physical Exam        Assessment & Plan:  Dysuria - U/A dip positive leuk. Urine culture pending - Stat wet prep pending - Advised patient to take Azo OTC and increase fluids. We will call with wet prep results today.  Will treat for UTI with macrobid since wet prep negative. If culture negative will call and tell pt to stop abx and follow up if symptoms persist.

## 2014-06-04 NOTE — Patient Instructions (Signed)
Take AZO over the counter. Increase fluid intake. Wait for call for wet prep results. Schedule follow up appointment if symptoms worsen or persist.

## 2014-06-08 LAB — URINE CULTURE: Colony Count: 50000

## 2014-10-19 ENCOUNTER — Other Ambulatory Visit: Payer: Self-pay | Admitting: Family Medicine

## 2014-10-19 NOTE — Telephone Encounter (Signed)
Attempted to contact Pt to schedule appt. No answer, left voicemail.

## 2014-11-20 ENCOUNTER — Other Ambulatory Visit: Payer: Self-pay | Admitting: Family Medicine

## 2014-12-09 ENCOUNTER — Other Ambulatory Visit: Payer: Self-pay | Admitting: Family Medicine

## 2014-12-11 ENCOUNTER — Other Ambulatory Visit: Payer: Self-pay | Admitting: Family Medicine

## 2015-01-05 ENCOUNTER — Ambulatory Visit (INDEPENDENT_AMBULATORY_CARE_PROVIDER_SITE_OTHER): Payer: Self-pay | Admitting: Family Medicine

## 2015-01-05 ENCOUNTER — Encounter: Payer: Self-pay | Admitting: Family Medicine

## 2015-01-05 VITALS — BP 136/90 | HR 53 | Ht 61.0 in | Wt 190.0 lb

## 2015-01-05 DIAGNOSIS — H6983 Other specified disorders of Eustachian tube, bilateral: Secondary | ICD-10-CM

## 2015-01-05 DIAGNOSIS — Z13220 Encounter for screening for lipoid disorders: Secondary | ICD-10-CM

## 2015-01-05 DIAGNOSIS — Z1159 Encounter for screening for other viral diseases: Secondary | ICD-10-CM

## 2015-01-05 DIAGNOSIS — J4520 Mild intermittent asthma, uncomplicated: Secondary | ICD-10-CM

## 2015-01-05 DIAGNOSIS — I10 Essential (primary) hypertension: Secondary | ICD-10-CM

## 2015-01-05 DIAGNOSIS — K219 Gastro-esophageal reflux disease without esophagitis: Secondary | ICD-10-CM

## 2015-01-05 DIAGNOSIS — N912 Amenorrhea, unspecified: Secondary | ICD-10-CM

## 2015-01-05 DIAGNOSIS — Z114 Encounter for screening for human immunodeficiency virus [HIV]: Secondary | ICD-10-CM

## 2015-01-05 DIAGNOSIS — R232 Flushing: Secondary | ICD-10-CM

## 2015-01-05 DIAGNOSIS — N951 Menopausal and female climacteric states: Secondary | ICD-10-CM

## 2015-01-05 MED ORDER — ALBUTEROL SULFATE HFA 108 (90 BASE) MCG/ACT IN AERS: 2.0000 | INHALATION_SPRAY | Freq: Four times a day (QID) | RESPIRATORY_TRACT | Status: DC | PRN

## 2015-01-05 MED ORDER — SUMATRIPTAN SUCCINATE 100 MG PO TABS: ORAL_TABLET | ORAL | Status: DC

## 2015-01-05 MED ORDER — METOPROLOL-HYDROCHLOROTHIAZIDE 50-25 MG PO TABS: 1.0000 | ORAL_TABLET | Freq: Two times a day (BID) | ORAL | Status: DC

## 2015-01-05 MED ORDER — FLUTICASONE FUROATE-VILANTEROL 100-25 MCG/INH IN AEPB: 1.0000 | INHALATION_SPRAY | Freq: Every day | RESPIRATORY_TRACT | Status: DC

## 2015-01-05 MED ORDER — TRAZODONE HCL 50 MG PO TABS: ORAL_TABLET | ORAL | Status: DC

## 2015-01-05 NOTE — Progress Notes (Signed)
Subjective:    Patient ID: Carolyn [name], female    DOB: [birth year], 59 y.o.   MRN: [id number]  HPI Hypertension- Pt denies chest pain, SOB, dizziness, or heart palpitations.  Taking meds as directed w/o problems.  Denies medication side effects.  She's currently taking Lopressor HCT and tolerating it well without side effects or problems.  Asthma - she is currently on Breo with albuterol all when necessary. Last used her albuterol in almost 2 weeks.  Went out and cut grass and did well.    C/O hot flashes that stared about 7-8  Months. Almost nightl. Last period was 2 months ago and she is usually regular.  No mood change. No vaginal sxs.    Occ gets CP from her acid reflux. Takes her protonix daily.  Certain foods can trigger it.   Migraine headaces. HAsn't had one in several months but will like a refill on her imitrex on hand. Occ gets pain and tightness in upper shoulders.   She's also had some pressures and discomfort in the ears bilaterally on and off for the last several weeks. She says it may just be her sinuses but wants me to check. No drainage fevers chills or recent cold symptoms.  Review of Systems     Objective:   Physical Exam  Constitutional: She is oriented to person, place, and time. She appears well-developed and well-nourished.  HENT:  Head: Normocephalic and atraumatic.  Right Ear: External ear normal.  Left Ear: External ear normal.  Nose: Nose normal.  TMs and canals are clear.   Cardiovascular: Normal rate, regular rhythm and normal heart sounds.   Pulmonary/Chest: Effort normal and breath sounds normal.  Neurological: She is alert and oriented to person, place, and time.  Skin: Skin is warm and dry.  Psychiatric: She has a normal mood and affect. Her behavior is normal.          Assessment & Plan:  HTN - not maximally controlled. Increase blood pressure medication to twice a day. Follow-up in 3 months. Due for CMP and lipids.    Asthma, mild  intermittant.  We will continue. Through the rest of the summer. This typically when she has more trouble with her asthma and she's been doing great this summer and able to mow her lawn which is fantastic. I will see her back in the fall and if she's doing well without back down to just an inhaled corticosteroid. Hopefully we can continue that throughout the winter.  Hot flashes/amenorrhea - she ould like to check her hormones.  It sounds like she could be perimenopausal or maybe even postmenopausal. Will call with results once available. I recommend several over-the-counter products for such as black cohosh her evening primrose oil or even estrogen. Since her symptoms are more mild hopefully this will be helpful for her. If not then we can certainly discuss prescription medication regimens at her follow-up visit in 3 months.  Migraine HA - doing very well. Well controlled.  Will refill when necessary Imitrex.  GERD with esophagitis- since still having occasional breakthrough symptoms continue with PPI therapy daily. Hopefully we can sertraline her back or decrease her to an H2 blocker at some point time. Continue work on diet.  Due for HIV and Hep C screening.    Eustachian tube dysfunction-recommend a trial of a nasal steroid spray such as Flonase or Nasonex for that which are over-the-counter. Encouraged her to try for at least 10 days..  Time spetn 40  min with > 50% spent counseling about how flashes, perimenopause, migraines, eustachian dysfunction, asthma, hypertension

## 2015-01-06 LAB — COMPLETE METABOLIC PANEL WITH GFR
ALK PHOS: 49 U/L (ref 39–117)
ALT: 8 U/L (ref 0–35)
AST: 13 U/L (ref 0–37)
Albumin: 4 g/dL (ref 3.5–5.2)
BUN: 18 mg/dL (ref 6–23)
CALCIUM: 9.4 mg/dL (ref 8.4–10.5)
CHLORIDE: 104 meq/L (ref 96–112)
CO2: 23 meq/L (ref 19–32)
Creat: 0.91 mg/dL (ref 0.50–1.10)
GFR, EST AFRICAN AMERICAN: 81 mL/min
GFR, Est Non African American: 70 mL/min
GLUCOSE: 73 mg/dL (ref 70–99)
POTASSIUM: 3.8 meq/L (ref 3.5–5.3)
Sodium: 142 mEq/L (ref 135–145)
Total Bilirubin: 0.3 mg/dL (ref 0.2–1.2)
Total Protein: 7.1 g/dL (ref 6.0–8.3)

## 2015-01-06 LAB — LUTEINIZING HORMONE: LH: 32 m[IU]/mL

## 2015-01-06 LAB — PROGESTERONE

## 2015-01-06 LAB — LIPID PANEL
CHOL/HDL RATIO: 4.6 ratio
CHOLESTEROL: 218 mg/dL — AB (ref 0–200)
HDL: 47 mg/dL (ref >=46)
LDL CALC: 152 mg/dL — AB (ref 0–99)
Triglycerides: 93 mg/dL (ref <150)
VLDL: 19 mg/dL (ref 0–40)

## 2015-01-06 LAB — ESTRADIOL: Estradiol: 15.5 pg/mL

## 2015-01-06 LAB — HEPATITIS C ANTIBODY: HCV AB: NEGATIVE

## 2015-01-06 LAB — HIV ANTIBODY (ROUTINE TESTING W REFLEX): HIV: NONREACTIVE

## 2015-01-06 LAB — FOLLICLE STIMULATING HORMONE: FSH: 59.3 m[IU]/mL

## 2015-01-12 ENCOUNTER — Other Ambulatory Visit: Payer: Self-pay | Admitting: Family Medicine

## 2015-01-12 MED ORDER — METOPROLOL-HYDROCHLOROTHIAZIDE 100-25 MG PO TABS: 1.0000 | ORAL_TABLET | Freq: Every day | ORAL | Status: DC

## 2015-05-10 ENCOUNTER — Ambulatory Visit: Payer: BLUE CROSS/BLUE SHIELD | Admitting: Family Medicine

## 2015-08-04 ENCOUNTER — Telehealth: Payer: Self-pay | Admitting: Family Medicine

## 2015-08-04 ENCOUNTER — Other Ambulatory Visit: Payer: Self-pay | Admitting: Family Medicine

## 2015-08-04 NOTE — Telephone Encounter (Signed)
I called pt and scheduled her for a F/u on HTN with Dr. Linford Arnold  For 08-25-15

## 2015-08-12 ENCOUNTER — Other Ambulatory Visit: Payer: Self-pay | Admitting: Family Medicine

## 2015-08-12 NOTE — Telephone Encounter (Signed)
Due for f/u before future refills

## 2015-08-25 ENCOUNTER — Ambulatory Visit: Payer: Self-pay | Admitting: Family Medicine

## 2015-09-08 ENCOUNTER — Encounter: Payer: Self-pay | Admitting: Family Medicine

## 2015-09-08 ENCOUNTER — Ambulatory Visit (INDEPENDENT_AMBULATORY_CARE_PROVIDER_SITE_OTHER): Payer: Self-pay | Admitting: Family Medicine

## 2015-09-08 VITALS — BP 127/66 | HR 46 | Wt 194.0 lb

## 2015-09-08 DIAGNOSIS — M5090 Cervical disc disorder, unspecified, unspecified cervical region: Secondary | ICD-10-CM

## 2015-09-08 DIAGNOSIS — K219 Gastro-esophageal reflux disease without esophagitis: Secondary | ICD-10-CM

## 2015-09-08 DIAGNOSIS — Z23 Encounter for immunization: Secondary | ICD-10-CM

## 2015-09-08 DIAGNOSIS — I10 Essential (primary) hypertension: Secondary | ICD-10-CM

## 2015-09-08 DIAGNOSIS — Z1231 Encounter for screening mammogram for malignant neoplasm of breast: Secondary | ICD-10-CM

## 2015-09-08 DIAGNOSIS — G4700 Insomnia, unspecified: Secondary | ICD-10-CM

## 2015-09-08 MED ORDER — ALBUTEROL SULFATE HFA 108 (90 BASE) MCG/ACT IN AERS: 2.0000 | INHALATION_SPRAY | Freq: Four times a day (QID) | RESPIRATORY_TRACT | Status: DC | PRN

## 2015-09-08 MED ORDER — MELOXICAM 15 MG PO TABS: 15.0000 mg | ORAL_TABLET | Freq: Every day | ORAL | Status: DC

## 2015-09-08 MED ORDER — METOPROLOL-HYDROCHLOROTHIAZIDE 100-25 MG PO TABS: 1.0000 | ORAL_TABLET | Freq: Every day | ORAL | Status: DC

## 2015-09-08 MED ORDER — FLUTICASONE FUROATE-VILANTEROL 100-25 MCG/INH IN AEPB
1.0000 | INHALATION_SPRAY | Freq: Every day | RESPIRATORY_TRACT | Status: DC
Start: 2015-09-08 — End: 2015-09-08

## 2015-09-08 MED ORDER — PANTOPRAZOLE SODIUM 40 MG PO TBEC: 40.0000 mg | DELAYED_RELEASE_TABLET | Freq: Every day | ORAL | Status: DC

## 2015-09-08 MED ORDER — SUMATRIPTAN SUCCINATE 100 MG PO TABS: ORAL_TABLET | ORAL | Status: DC

## 2015-09-08 MED ORDER — TRAZODONE HCL 50 MG PO TABS: ORAL_TABLET | ORAL | Status: DC

## 2015-09-08 NOTE — Progress Notes (Signed)
   Subjective:    Patient ID: Carolyn [name], female    DOB: November 21, 1955, 60 y.o.   MRN: [id number]  HPI Hypertension- Pt denies chest pain, SOB, dizziness, or heart palpitations.  Taking meds as directed w/o problems.  Denies medication side effects.    Asthma -  She is actually doing very well. She said she uses her albuterol maybe twice a week no nighttime symptoms. [facility] is actually [facility]. She's even done well with the spring allergy so far.  Migraine HA - she occasionally uses Imitrex but very rarely. Usually less than once a month.Next  Insomnia-she uses trazodone when necessary. She denies any excess sedation or side effects with the medication.  Is also had more lower neck and upper back pain recently. She says she has cervical disc disease and sometimes it bothers her a little bit more in the winter. She's used Mobic in the past and it was helpful. She would like to have a perception for that again. She doesn't use any heat or ice. She's been trying to stretch it.  GERD-she would like a refill on her Protonix. She is concerned about some news reports that she saw on television and would like to discuss that today.  Review of Systems     Objective:   Physical Exam  Constitutional: She is oriented to person, place, and time. She appears well-developed and well-nourished.  HENT:  Head: Normocephalic and atraumatic.  Cardiovascular: Normal rate, regular rhythm and normal heart sounds.   Pulmonary/Chest: Effort normal and breath sounds normal.  Neurological: She is alert and oriented to person, place, and time.  Skin: Skin is warm and dry.  Psychiatric: She has a normal mood and affect. Her behavior is normal.          Assessment & Plan:  HTN - Well controlled. Continue current regimen. Follow up in 6 mo  Asthma-mild persistent-continue with as needed albuterol use. I'll go ahead and remove Breo from her list. If she finds she is using her  inhaler a little bit more frequently as we've into spring and she can call the office and we can go ahead and put her on a inhaled corticosteroid.   Migraine headache-well-controlled. Continue to monitor frequency. Next  GERD-we did discuss long-term effects of PPIs. I did encourage her to try to wean the medication. Refills sent to the pharmacy today.  Insomnia-did refill her trazodone.  Cervical degenerative disc disease-the go ahead and refill her Mobic today. Encouraged 3 to use heat and gentle stretches.

## 2015-09-09 LAB — BASIC METABOLIC PANEL
BUN: 14 mg/dL (ref 7–25)
CHLORIDE: 105 mmol/L (ref 98–110)
CO2: 25 mmol/L (ref 20–31)
Calcium: 9 mg/dL (ref 8.6–10.4)
Creat: 0.85 mg/dL (ref 0.50–1.05)
Glucose, Bld: 81 mg/dL (ref 65–99)
POTASSIUM: 3.8 mmol/L (ref 3.5–5.3)
SODIUM: 137 mmol/L (ref 135–146)

## 2015-09-09 NOTE — Progress Notes (Signed)
Quick Note:  All labs are normal. ______ 

## 2016-04-05 ENCOUNTER — Other Ambulatory Visit: Payer: Self-pay | Admitting: Family Medicine

## 2016-04-11 ENCOUNTER — Telehealth: Payer: Self-pay | Admitting: Family Medicine

## 2016-04-11 NOTE — Telephone Encounter (Signed)
I called pt and left message for her to call our office and schedule a F/u on BP appointment with Dr.Metheney

## 2016-05-14 ENCOUNTER — Other Ambulatory Visit: Payer: Self-pay | Admitting: Family Medicine

## 2016-07-02 ENCOUNTER — Ambulatory Visit (INDEPENDENT_AMBULATORY_CARE_PROVIDER_SITE_OTHER): Payer: Self-pay | Admitting: Family Medicine

## 2016-07-02 ENCOUNTER — Encounter: Payer: Self-pay | Admitting: Family Medicine

## 2016-07-02 VITALS — BP 139/81 | HR 55 | Wt 192.0 lb

## 2016-07-02 DIAGNOSIS — K219 Gastro-esophageal reflux disease without esophagitis: Secondary | ICD-10-CM

## 2016-07-02 DIAGNOSIS — Z23 Encounter for immunization: Secondary | ICD-10-CM

## 2016-07-02 DIAGNOSIS — J4530 Mild persistent asthma, uncomplicated: Secondary | ICD-10-CM

## 2016-07-02 DIAGNOSIS — G43109 Migraine with aura, not intractable, without status migrainosus: Secondary | ICD-10-CM

## 2016-07-02 DIAGNOSIS — Z1231 Encounter for screening mammogram for malignant neoplasm of breast: Secondary | ICD-10-CM

## 2016-07-02 DIAGNOSIS — I10 Essential (primary) hypertension: Secondary | ICD-10-CM

## 2016-07-02 MED ORDER — MELOXICAM 15 MG PO TABS: 15.0000 mg | ORAL_TABLET | Freq: Every day | ORAL | 2 refills | Status: DC

## 2016-07-02 MED ORDER — TRAZODONE HCL 50 MG PO TABS: ORAL_TABLET | ORAL | 6 refills | Status: DC

## 2016-07-02 MED ORDER — SUMATRIPTAN SUCCINATE 100 MG PO TABS: ORAL_TABLET | ORAL | 6 refills | Status: DC

## 2016-07-02 MED ORDER — PANTOPRAZOLE SODIUM 40 MG PO TBEC: 40.0000 mg | DELAYED_RELEASE_TABLET | Freq: Every day | ORAL | 6 refills | Status: DC

## 2016-07-02 MED ORDER — METOPROLOL-HYDROCHLOROTHIAZIDE 100-25 MG PO TABS: ORAL_TABLET | ORAL | 6 refills | Status: DC

## 2016-07-02 MED ORDER — ALBUTEROL SULFATE HFA 108 (90 BASE) MCG/ACT IN AERS: 2.0000 | INHALATION_SPRAY | Freq: Four times a day (QID) | RESPIRATORY_TRACT | 2 refills | Status: DC | PRN

## 2016-07-02 NOTE — Progress Notes (Signed)
Subjective:    CC: HTN  HPI: Hypertension- Pt denies chest pain, SOB, dizziness, or heart palpitations.  Taking meds as directed w/o problems.  Denies medication side effects.    Asthma - Overall she is doing really well. She is rarely using her albuterol and has not had any flares or exacerbations recently.  GERD  - Currently on omeprazole daily. Most times it does work occasionally has some breakthrough symptoms. That she admits she does sometimes eat foods that trigger her symptoms.  Migraine - migraines have been well controlled. No specific concerns or problems.   Past medical history, Surgical history, Family history not pertinant except as noted below, Social history, Allergies, and medications have been entered into the medical record, reviewed, and corrections made.   Review of Systems: No fevers, chills, night sweats, weight loss, chest pain, or shortness of breath.   Objective:    General: Well Developed, well nourished, and in no acute distress.  Neuro: Alert and oriented x3, extra-ocular muscles intact, sensation grossly intact.  HEENT: Normocephalic, atraumatic  Skin: Warm and dry, no rashes. Cardiac: Regular rate and rhythm, no murmurs rubs or gallops, no lower extremity edema. No carotid bruits.  Respiratory: Clear to auscultation bilaterally. Not using accessory muscles, speaking in full sentences.   Impression and Recommendations:    HTN - Well controlled. Continue current regimen. Follow up in  6 months.   Asthma - Well controlled. Just use albuterol as needed.  GERD  - well-controlled. Continue omeprazole. Continue to work on dietary measures as well.  Migraine headaches-well-controlled. Continue current regimen. Just uses when necessary Imitrex. Will refill prescription today.

## 2016-08-07 ENCOUNTER — Other Ambulatory Visit (HOSPITAL_COMMUNITY)
Admission: RE | Admit: 2016-08-07 | Discharge: 2016-08-07 | Disposition: A | Discharge status: Home / Self Care | Payer: BLUE CROSS/BLUE SHIELD | Source: Ambulatory Visit | Attending: Family Medicine | Admitting: Family Medicine

## 2016-08-07 ENCOUNTER — Ambulatory Visit (INDEPENDENT_AMBULATORY_CARE_PROVIDER_SITE_OTHER): Payer: BLUE CROSS/BLUE SHIELD | Admitting: Family Medicine

## 2016-08-07 ENCOUNTER — Encounter: Payer: Self-pay | Admitting: Family Medicine

## 2016-08-07 VITALS — BP 129/50 | HR 53 | Ht 61.0 in | Wt 188.0 lb

## 2016-08-07 DIAGNOSIS — Z01419 Encounter for gynecological examination (general) (routine) without abnormal findings: Secondary | ICD-10-CM | POA: Insufficient documentation

## 2016-08-07 DIAGNOSIS — Z124 Encounter for screening for malignant neoplasm of cervix: Secondary | ICD-10-CM

## 2016-08-07 DIAGNOSIS — Z1231 Encounter for screening mammogram for malignant neoplasm of breast: Secondary | ICD-10-CM

## 2016-08-07 DIAGNOSIS — Z1151 Encounter for screening for human papillomavirus (HPV): Secondary | ICD-10-CM | POA: Insufficient documentation

## 2016-08-07 DIAGNOSIS — Z0000 Encounter for general adult medical examination without abnormal findings: Secondary | ICD-10-CM | POA: Diagnosis not present

## 2016-08-07 NOTE — Progress Notes (Signed)
Subjective:     Carolyn [name] is a 61 y.o. female and is here for a comprehensive physical exam. The patient reports no problems.  Social History   Social History  . Marital status: Married    Spouse name: N/A  . Number of children: N/A  . Years of education: N/A   Occupational History  . Not on file.   Social History Main Topics  . Smoking status: Never Smoker  . Smokeless tobacco: Never Used  . Alcohol use No  . Drug use: No  . Sexual activity: Not on file   Other Topics Concern  . Not on file   Social History Narrative  . No narrative on file   Health Maintenance  Topic Date Due  . MAMMOGRAM  01/06/2014  . PAP SMEAR  04/22/2015  . COLONOSCOPY  05/10/2018  . TETANUS/TDAP  07/02/2026  . INFLUENZA VACCINE  Completed  . Hepatitis C Screening  Completed  . HIV Screening  Completed    The following portions of the patient's history were reviewed and updated as appropriate: allergies, current medications, past family history, past medical history, past social history, past surgical history and problem list.  Review of Systems A comprehensive review of systems was negative.   Objective:    BP (!) 129/50   Pulse (!) 53   Ht 5\' 1"  (1.549 m)   Wt 188 lb (85.3 kg)   SpO2 97%   BMI 35.52 kg/m  General appearance: alert, cooperative and appears stated age Head: Normocephalic, without obvious abnormality, atraumatic Eyes: conj clear, EOMI, PEERLA Ears: normal TM's and external ear canals both ears Nose: Nares normal. Septum midline. Mucosa normal. No drainage or sinus tenderness. Throat: lips, mucosa, and tongue normal; teeth and gums normal Neck: no adenopathy, no carotid bruit, no JVD, supple, symmetrical, trachea midline and thyroid not enlarged, symmetric, no tenderness/mass/nodules Back: symmetric, no curvature. ROM normal. No CVA tenderness. Lungs: clear to auscultation bilaterally Breasts: normal appearance, no masses or tenderness Heart: regular rate and  rhythm, S1, S2 normal, no murmur, click, rub or gallop Abdomen: soft, non-tender; bowel sounds normal; no masses,  no organomegaly Pelvic: cervix normal in appearance, external genitalia normal, no adnexal masses or tenderness, no cervical motion tenderness, rectovaginal septum normal, uterus normal size, shape, and consistency and vagina normal without discharge Extremities: extremities normal, atraumatic, no cyanosis or edema Pulses: 2+ and symmetric Skin: Skin color, texture, turgor normal. No rashes or lesions Lymph nodes: Cervical, supraclavicular, and axillary nodes normal. Neurologic: Alert and oriented X 3, normal strength and tone. Normal symmetric reflexes. Normal coordination and gait    Assessment:    Healthy female exam.      Plan:     See After Visit Summary for Counseling Recommendations    Keep up a regular exercise program and make sure you are eating a healthy diet Try to eat 4 servings of dairy a day, or if you are lactose intolerant take a calcium with vitamin D daily.  Your vaccines are up to date.  Discussed need for shingles vaccine. Handout provided.

## 2016-08-08 LAB — COMPLETE METABOLIC PANEL WITH GFR
ALBUMIN: 4 g/dL (ref 3.6–5.1)
ALK PHOS: 46 U/L (ref 33–130)
ALT: 8 U/L (ref 6–29)
AST: 14 U/L (ref 10–35)
BILIRUBIN TOTAL: 0.4 mg/dL (ref 0.2–1.2)
BUN: 16 mg/dL (ref 7–25)
CALCIUM: 9.5 mg/dL (ref 8.6–10.4)
CHLORIDE: 101 mmol/L (ref 98–110)
CO2: 26 mmol/L (ref 20–31)
CREATININE: 0.96 mg/dL (ref 0.50–1.05)
GFR, Est African American: 75 mL/min (ref >=60)
GFR, Est Non African American: 65 mL/min (ref >=60)
Glucose, Bld: 76 mg/dL (ref 65–99)
Potassium: 3.8 mmol/L (ref 3.5–5.3)
Sodium: 140 mmol/L (ref 135–146)
TOTAL PROTEIN: 7.2 g/dL (ref 6.1–8.1)

## 2016-08-08 LAB — CBC
HEMATOCRIT: 41.5 % (ref 35.0–45.0)
HEMOGLOBIN: 13.2 g/dL (ref 11.7–15.5)
MCH: 26.6 pg — AB (ref 27.0–33.0)
MCHC: 31.8 g/dL — AB (ref 32.0–36.0)
MCV: 83.7 fL (ref 80.0–100.0)
MPV: 9.9 fL (ref 7.5–12.5)
Platelets: 280 10*3/uL (ref 140–400)
RBC: 4.96 MIL/uL (ref 3.80–5.10)
RDW: 14.9 % (ref 11.0–15.0)
WBC: 5 10*3/uL (ref 3.8–10.8)

## 2016-08-08 LAB — LIPID PANEL W/REFLEX DIRECT LDL
CHOL/HDL RATIO: 4.9 ratio (ref <5.0)
CHOLESTEROL: 192 mg/dL (ref <200)
HDL: 39 mg/dL — AB (ref >50)
LDL-Cholesterol: 137 mg/dL — ABNORMAL HIGH
Non-HDL Cholesterol (Calc): 153 mg/dL — ABNORMAL HIGH (ref <130)
Triglycerides: 72 mg/dL (ref <150)

## 2016-08-08 LAB — TSH: TSH: 0.43 m[IU]/L

## 2016-08-10 LAB — CYTOLOGY - PAP
DIAGNOSIS: NEGATIVE
HPV (WINDOPATH): NOT DETECTED

## 2016-08-10 NOTE — Progress Notes (Signed)
Call patient: Your Pap smear is normal. Repeat in 5 years.

## 2016-09-21 ENCOUNTER — Ambulatory Visit: Payer: BLUE CROSS/BLUE SHIELD

## 2016-10-16 ENCOUNTER — Ambulatory Visit: Payer: BLUE CROSS/BLUE SHIELD

## 2016-10-19 ENCOUNTER — Ambulatory Visit: Payer: BLUE CROSS/BLUE SHIELD

## 2017-02-04 ENCOUNTER — Ambulatory Visit: Payer: BLUE CROSS/BLUE SHIELD | Admitting: Family Medicine

## 2017-06-12 ENCOUNTER — Other Ambulatory Visit: Payer: Self-pay | Admitting: Family Medicine

## 2017-07-19 ENCOUNTER — Other Ambulatory Visit: Payer: Self-pay | Admitting: Family Medicine

## 2017-08-05 ENCOUNTER — Other Ambulatory Visit: Payer: Self-pay | Admitting: Family Medicine

## 2017-09-05 ENCOUNTER — Ambulatory Visit (INDEPENDENT_AMBULATORY_CARE_PROVIDER_SITE_OTHER): Payer: BLUE CROSS/BLUE SHIELD | Admitting: Family Medicine

## 2017-09-05 ENCOUNTER — Encounter: Payer: Self-pay | Admitting: Family Medicine

## 2017-09-05 VITALS — BP 123/80 | HR 49 | Ht 61.0 in | Wt 185.0 lb

## 2017-09-05 DIAGNOSIS — K2100 Gastro-esophageal reflux disease with esophagitis, without bleeding: Secondary | ICD-10-CM

## 2017-09-05 DIAGNOSIS — G479 Sleep disorder, unspecified: Secondary | ICD-10-CM

## 2017-09-05 DIAGNOSIS — F339 Major depressive disorder, recurrent, unspecified: Secondary | ICD-10-CM

## 2017-09-05 DIAGNOSIS — I10 Essential (primary) hypertension: Secondary | ICD-10-CM

## 2017-09-05 MED ORDER — PANTOPRAZOLE SODIUM 40 MG PO TBEC: 40.0000 mg | DELAYED_RELEASE_TABLET | Freq: Every day | ORAL | 3 refills | Status: DC

## 2017-09-05 MED ORDER — TRAZODONE HCL 50 MG PO TABS: ORAL_TABLET | ORAL | 3 refills | Status: DC

## 2017-09-05 MED ORDER — FLUOXETINE HCL 20 MG PO TABS: ORAL_TABLET | ORAL | 3 refills | Status: DC

## 2017-09-05 MED ORDER — SUMATRIPTAN SUCCINATE 100 MG PO TABS: ORAL_TABLET | ORAL | 6 refills | Status: DC

## 2017-09-05 MED ORDER — VENLAFAXINE HCL ER 37.5 MG PO CP24: 37.5000 mg | ORAL_CAPSULE | Freq: Every day | ORAL | 1 refills | Status: DC

## 2017-09-05 MED ORDER — METOPROLOL-HYDROCHLOROTHIAZIDE 100-25 MG PO TABS: ORAL_TABLET | ORAL | 3 refills | Status: DC

## 2017-09-05 NOTE — Progress Notes (Signed)
Subjective:    CC: HTN  HPI:  She hasn't been in in awhile bc she doesn' have insurance right now.  Hypertension- Pt denies chest pain, SOB, dizziness, or heart palpitations.  Taking meds as directed w/o problems.  Denies medication side effects.   F/U insomnia - she is doing well on the trazodone and would like a refill today.  Has been expensing some hot flashes as well.  He has felt a little more down and depressed recently.  She says is just family things.  Nothing out of the norm.  She does report little interest and pleasure doing things nearly every day and feeling down several days of the week.  She also reports a little bit of anxiety as well.  She says she like to go back on medication for it.  GERD-she takes her PPI most days but not every day.  She will occasionally get a little chest discomfort but feels like it is directly related to her reflux.   Objective:    General: Well Developed, well nourished, and in no acute distress.  Neuro: Alert and oriented x3, extra-ocular muscles intact, sensation grossly intact.  HEENT: Normocephalic, atraumatic  Skin: Warm and dry, no rashes. Cardiac: Regular rate and rhythm, no murmurs rubs or gallops, no lower extremity edema.  Respiratory: Clear to auscultation bilaterally. Not using accessory muscles, speaking in full sentences.   Impression and Recommendations:    HTN -well controlled.  Medication refilled.  We did not do labs today because she is without insurance.  We will try to get that updated at the next office visit.  Insomnia -refilled her trazodone today.  GERD -refilled her PPI.  Depression-we will try Effexor.  I think this could also help with her hot flashes at the same time.  That is generic can be a little pricey I also gave her a backup prescription for fluoxetine in case the Effexor is too expensive.  Either way I asked her to call in a month and let me know how she is doing so that I can adjust her dose if  needed.

## 2017-09-05 NOTE — Patient Instructions (Addendum)
If the effexor is too expensive you can give them the script for the fluoxetine.  [facility]   Call me in a month if you think we need to go up on your medication dose for your mood.

## 2017-09-17 ENCOUNTER — Other Ambulatory Visit: Payer: Self-pay | Admitting: *Deleted

## 2017-09-17 MED ORDER — FLUOXETINE HCL 10 MG PO TABS: ORAL_TABLET | ORAL | 1 refills | Status: DC

## 2017-09-19 ENCOUNTER — Telehealth: Payer: Self-pay

## 2017-09-19 ENCOUNTER — Other Ambulatory Visit: Payer: Self-pay | Admitting: Family Medicine

## 2017-09-19 MED ORDER — FLUOXETINE HCL 20 MG PO CAPS: 20.0000 mg | ORAL_CAPSULE | Freq: Every day | ORAL | 1 refills | Status: DC

## 2017-09-19 NOTE — Telephone Encounter (Signed)
The sent a prescription for fluoxetine electronically.  I believe that is the one that she is speaking of.  She just needs to call her pharmacy and pick it up.

## 2017-09-20 NOTE — Telephone Encounter (Signed)
Left VM with status update.  

## 2018-04-24 ENCOUNTER — Other Ambulatory Visit: Payer: Self-pay | Admitting: Family Medicine

## 2018-11-08 ENCOUNTER — Other Ambulatory Visit: Payer: Self-pay | Admitting: Family Medicine

## 2018-11-17 ENCOUNTER — Ambulatory Visit (INDEPENDENT_AMBULATORY_CARE_PROVIDER_SITE_OTHER): Payer: Self-pay | Admitting: Family Medicine

## 2018-11-17 ENCOUNTER — Encounter: Payer: Self-pay | Admitting: Family Medicine

## 2018-11-17 VITALS — BP 148/80 | Ht 61.0 in

## 2018-11-17 DIAGNOSIS — M545 Low back pain, unspecified: Secondary | ICD-10-CM

## 2018-11-17 DIAGNOSIS — G4700 Insomnia, unspecified: Secondary | ICD-10-CM | POA: Insufficient documentation

## 2018-11-17 DIAGNOSIS — I10 Essential (primary) hypertension: Secondary | ICD-10-CM

## 2018-11-17 DIAGNOSIS — F330 Major depressive disorder, recurrent, mild: Secondary | ICD-10-CM

## 2018-11-17 DIAGNOSIS — M533 Sacrococcygeal disorders, not elsewhere classified: Secondary | ICD-10-CM

## 2018-11-17 DIAGNOSIS — R350 Frequency of micturition: Secondary | ICD-10-CM

## 2018-11-17 DIAGNOSIS — R635 Abnormal weight gain: Secondary | ICD-10-CM

## 2018-11-17 LAB — POCT URINALYSIS DIPSTICK
Bilirubin, UA: NEGATIVE
Blood, UA: NEGATIVE
Glucose, UA: NEGATIVE
Ketones, UA: NEGATIVE
Nitrite, UA: NEGATIVE
Protein, UA: NEGATIVE
Spec Grav, UA: 1.02 (ref 1.010–1.025)
Urobilinogen, UA: 0.2 E.U./dL
pH, UA: 7 (ref 5.0–8.0)

## 2018-11-17 MED ORDER — ESCITALOPRAM OXALATE 10 MG PO TABS: ORAL_TABLET | ORAL | 3 refills | Status: DC

## 2018-11-17 MED ORDER — TRAZODONE HCL 50 MG PO TABS: 50.0000 mg | ORAL_TABLET | Freq: Every day | ORAL | 3 refills | Status: DC

## 2018-11-17 MED ORDER — MELOXICAM 15 MG PO TABS: 15.0000 mg | ORAL_TABLET | Freq: Every day | ORAL | 5 refills | Status: DC

## 2018-11-17 MED ORDER — METOPROLOL-HYDROCHLOROTHIAZIDE 100-25 MG PO TABS: ORAL_TABLET | ORAL | 3 refills | Status: DC

## 2018-11-17 NOTE — Assessment & Plan Note (Signed)
Uncontrolled.  Recommend go ahead and restart medication.  Like to have her come back in a couple months just to make sure that is getting back under control.

## 2018-11-17 NOTE — Assessment & Plan Note (Addendum)
Since she takes 1 at night.Continue with trazodone but will adjust dosing on the dscrip.

## 2018-11-17 NOTE — Assessment & Plan Note (Signed)
Discussed options.  We will go ahead and add fluoxetine to her intolerance list since it caused headaches.  She also previously tried Wellbutrin which caused palpitations.  We will try Lexapro instead.  Ad lib. to see her back in about 1 month but she preferred to wait to since she does not currently have health insurance.

## 2018-11-17 NOTE — Progress Notes (Signed)
Established Patient Office Visit  Subjective:  Patient ID: Carolyn [name], female    DOB: 1955-10-04  Age: 63 y.o. MRN: [id number]  CC:  Chief Complaint  Patient presents with  . Hypertension    HPI Carolyn [name] presents for:  Hypertension- Pt denies chest pain, SOB, dizziness, or heart palpitations.  She is currently out of medication and has been for quite some time is been over a year since she is been in the office.  F/U Depression -ventral he quit taking the fluoxetine because it was giving her headaches.  She would like to consider trying something else.  He has had some significant increased stress.  She is feeling a lot of pressure with everything going on including COVID as well as rioting in the country in regards to racism.  He is just very nervous about things and says she feels scared.  Insomnia-she still uses the trazodone but says she only takes 1 pill now.  She used to take 2 but now she finds it gives her headaches it does help her sleep some.  He also complains of mid low back pain though sometimes she feels a little bit worse on the right side been going on for about 3 to 4 weeks now.  She is noticed some urinary frequency as well.  No blood in the urine.  No dysuria.  She does have a history of kidney stones but says this feels different.  She denies any fevers chills or sweats.she is using Tylenol prn for pain.     Past Medical History:  Diagnosis Date  . Arthritis    neck  . Asthma    mild persistent  . Constipation   . Depression   . Eczema   . Female bladder prolapse   . Insomnia   . SUI (stress urinary incontinence, female)     No past surgical history on file.  Family History  Problem Relation Age of Onset  . Hypertension Father   . Hypertension Sister   . Hypertension Sister   . Cancer Other        breast- late 7's    Social History   Socioeconomic History  . Marital status: Married    Spouse name: Not on file  . Number of  children: Not on file  . Years of education: Not on file  . Highest education level: Not on file  Occupational History  . Not on file  Social Needs  . Financial resource strain: Not on file  . Food insecurity:    Worry: Not on file    Inability: Not on file  . Transportation needs:    Medical: Not on file    Non-medical: Not on file  Tobacco Use  . Smoking status: Never Smoker  . Smokeless tobacco: Never Used  Substance and Sexual Activity  . Alcohol use: No  . Drug use: No  . Sexual activity: Not on file  Lifestyle  . Physical activity:    Days per week: Not on file    Minutes per session: Not on file  . Stress: Not on file  Relationships  . Social connections:    Talks on phone: Not on file    Gets together: Not on file    Attends religious service: Not on file    Active member of club or organization: Not on file    Attends meetings of clubs or organizations: Not on file    Relationship status: Not on file  .  Intimate partner violence:    Fear of current or ex partner: Not on file    Emotionally abused: Not on file    Physically abused: Not on file    Forced sexual activity: Not on file  Other Topics Concern  . Not on file  Social History Narrative  . Not on file    Outpatient Medications Prior to Visit  Medication Sig Dispense Refill  . albuterol (PROAIR HFA) 108 (90 Base) MCG/ACT inhaler Inhale 2 puffs into the lungs every 6 (six) hours as needed for wheezing. (Patient not taking: Reported on 11/17/2018) 1 Inhaler 2  . pantoprazole (PROTONIX) 40 MG tablet TAKE 1 TABLET BY MOUTH ONCE DAILY (Patient not taking: Reported on 11/17/2018) 90 tablet 3  . SUMAtriptan (IMITREX) 100 MG tablet take 1 tablet by mouth AT ONSET OF MIGRAINE MAY REPEAT ONCE AFTER 2 HOURS (Patient not taking: Reported on 11/17/2018) 8 tablet 6  . FLUoxetine (PROZAC) 20 MG capsule Take 1 capsule (20 mg total) by mouth daily. 30 capsule 1  . meloxicam (MOBIC) 15 MG tablet Take 1 tablet (15 mg total) by  mouth daily. 30 tablet 2  . metoprolol-hydrochlorothiazide (LOPRESSOR HCT) 100-25 MG tablet take 1 tablet by mouth once daily. (Patient not taking: Reported on 11/17/2018) 90 tablet 3  . traZODone (DESYREL) 50 MG tablet TAKE 2- 6 TABLETS BY MOUTH AT BEDTIME. LAST REFILL YOU MUST SCHEDULE AN APPOINTMENT (Patient not taking: Reported on 11/17/2018) 180 tablet 3   No facility-administered medications prior to visit.     Allergies  Allergen Reactions  . Ace Inhibitors     REACTION: cough, also with ARBS  . Ciprofloxacin Other (See Comments)    Headache  . Fluoxetine Other (See Comments)    Headaches  . Montelukast Sodium Other (See Comments)    Headache   . Wellbutrin [Bupropion] Palpitations    ROS Review of Systems    Objective:    Physical Exam  Constitutional: She is oriented to person, place, and time. She appears well-developed and well-nourished.  HENT:  Head: Normocephalic and atraumatic.  Cardiovascular: Normal rate, regular rhythm and normal heart sounds.  Pulmonary/Chest: Effort normal and breath sounds normal.  Musculoskeletal:     Comments: Nontender over the lumbar spine but she is tender over the right SI joint.  Neurological: She is alert and oriented to person, place, and time.  Skin: Skin is warm and dry.  Psychiatric: She has a normal mood and affect. Her behavior is normal.    BP (!) 148/80   Ht 5\' 1"  (1.549 m)   BMI 34.96 kg/m  Wt Readings from Last 3 Encounters:  09/05/17 185 lb (83.9 kg)  08/07/16 188 lb (85.3 kg)  07/02/16 192 lb (87.1 kg)     Health Maintenance Due  Topic Date Due  . MAMMOGRAM  01/06/2014  . COLONOSCOPY  05/10/2018    There are no preventive care reminders to display for this patient.  Lab Results  Component Value Date   TSH 0.43 08/07/2016   Lab Results  Component Value Date   WBC 5.0 08/07/2016   HGB 13.2 08/07/2016   HCT 41.5 08/07/2016   MCV 83.7 08/07/2016   PLT 280 08/07/2016   Lab Results  Component Value  Date   NA 140 08/07/2016   K 3.8 08/07/2016   CO2 26 08/07/2016   GLUCOSE 76 08/07/2016   BUN 16 08/07/2016   CREATININE 0.96 08/07/2016   BILITOT 0.4 08/07/2016   ALKPHOS 46 08/07/2016  AST 14 08/07/2016   ALT 8 08/07/2016   PROT 7.2 08/07/2016   ALBUMIN 4.0 08/07/2016   CALCIUM 9.5 08/07/2016   Lab Results  Component Value Date   CHOL 192 08/07/2016   Lab Results  Component Value Date   HDL 39 (L) 08/07/2016   Lab Results  Component Value Date   LDLCALC 152 (H) 01/05/2015   Lab Results  Component Value Date   TRIG 72 08/07/2016   Lab Results  Component Value Date   CHOLHDL 4.9 08/07/2016   No results found for: HGBA1C    Assessment & Plan:   Problem List Items Addressed This Visit      Cardiovascular and Mediastinum   HYPERTENSION, BENIGN SYSTEMIC    Uncontrolled.  Recommend go ahead and restart medication.  Like to have her come back in a couple months just to make sure that is getting back under control.      Relevant Medications   metoprolol-hydrochlorothiazide (LOPRESSOR HCT) 100-25 MG tablet   Other Relevant Orders   COMPLETE METABOLIC PANEL WITH GFR   Lipid panel   TSH     Other   Major depressive disorder, recurrent episode (HCC) - Primary    Discussed options.  We will go ahead and add fluoxetine to her intolerance list since it caused headaches.  She also previously tried Wellbutrin which caused palpitations.  We will try Lexapro instead.  Ad lib. to see her back in about 1 month but she preferred to wait to since she does not currently have health insurance.      Relevant Medications   escitalopram (LEXAPRO) 10 MG tablet   traZODone (DESYREL) 50 MG tablet   Insomnia    Since she takes 1 at night.Continue with trazodone but will adjust dosing on the dscrip.         Other Visit Diagnoses    Abnormal weight gain       Disorder of SI (sacroiliac) joint       Urinary frequency       Relevant Orders   POCT urinalysis dipstick (Completed)    Urine Culture   Acute midline low back pain without sciatica       Relevant Medications   meloxicam (MOBIC) 15 MG tablet      SI joint pain and dysfunction-she is also been having some urinary frequency so we did go ahead and perform a urinalysis as well.  But I suspect that she probably has some musculoskeletal dysfunction to the SI joint.  Given handout for exercises to do on her own.  Okay to use anti-inflammatory and/or Tylenol.  Back pain-we will go ahead and restart her meloxicam.  She feels like it works well for her.  Urinary frequency-only positive for leukocytes negative for nitrites.  We will send for culture for confirmation.   Meds ordered this encounter  Medications  . escitalopram (LEXAPRO) 10 MG tablet    Sig: 1/2 tab po QD x 6 days then increase to whole tab daily    Dispense:  30 tablet    Refill:  3  . meloxicam (MOBIC) 15 MG tablet    Sig: Take 1 tablet (15 mg total) by mouth daily.    Dispense:  30 tablet    Refill:  5  . metoprolol-hydrochlorothiazide (LOPRESSOR HCT) 100-25 MG tablet    Sig: take 1 tablet by mouth once daily.    Dispense:  90 tablet    Refill:  3  . traZODone (DESYREL) 50 MG tablet  Sig: Take 1 tablet (50 mg total) by mouth at bedtime. TAKE 2- 6 TABLETS BY MOUTH AT BEDTIME. LAST REFILL YOU MUST SCHEDULE AN APPOINTMENT    Dispense:  90 tablet    Refill:  3   Depression-PHQ 9 score of 5 and gad 7 score of 8.  Discussed options.  Will give a trial of Lexapro.  Prescription sent to pharmacy.  In looking back at her records she had also tried Wellbutrin in the past and had palpitations.  We will try Lexapro instead.  Like to see her back in about 6 to 8 weeks to make sure that she is doing well.   Follow-up: Return in about 2 months (around 01/17/2019) for BP/Mood.    Nani Gasser, MD

## 2018-11-18 ENCOUNTER — Telehealth: Payer: Self-pay

## 2018-11-18 LAB — LIPID PANEL
Cholesterol: 217 mg/dL — ABNORMAL HIGH (ref <200)
HDL: 49 mg/dL — ABNORMAL LOW (ref >=50)
LDL Cholesterol (Calc): 145 mg/dL (calc) — ABNORMAL HIGH
Non-HDL Cholesterol (Calc): 168 mg/dL (calc) — ABNORMAL HIGH (ref <130)
Total CHOL/HDL Ratio: 4.4 (calc) (ref <5.0)
Triglycerides: 114 mg/dL (ref <150)

## 2018-11-18 LAB — COMPLETE METABOLIC PANEL WITH GFR
AG Ratio: 1.3 (calc) (ref 1.0–2.5)
ALT: 17 U/L (ref 6–29)
AST: 20 U/L (ref 10–35)
Albumin: 4.2 g/dL (ref 3.6–5.1)
Alkaline phosphatase (APISO): 56 U/L (ref 37–153)
BUN: 12 mg/dL (ref 7–25)
CO2: 27 mmol/L (ref 20–32)
Calcium: 9.5 mg/dL (ref 8.6–10.4)
Chloride: 105 mmol/L (ref 98–110)
Creat: 0.88 mg/dL (ref 0.50–0.99)
GFR, Est African American: 82 mL/min/{1.73_m2} (ref >=60)
GFR, Est Non African American: 71 mL/min/{1.73_m2} (ref >=60)
Globulin: 3.2 g/dL (calc) (ref 1.9–3.7)
Glucose, Bld: 86 mg/dL (ref 65–99)
Potassium: 4 mmol/L (ref 3.5–5.3)
Sodium: 139 mmol/L (ref 135–146)
Total Bilirubin: 0.4 mg/dL (ref 0.2–1.2)
Total Protein: 7.4 g/dL (ref 6.1–8.1)

## 2018-11-18 LAB — TSH: TSH: 0.35 mIU/L — ABNORMAL LOW (ref 0.40–4.50)

## 2018-11-18 LAB — URINE CULTURE
MICRO NUMBER:: 523939
SPECIMEN QUALITY:: ADEQUATE

## 2018-11-18 MED ORDER — TRAZODONE HCL 50 MG PO TABS: 50.0000 mg | ORAL_TABLET | Freq: Every day | ORAL | 3 refills | Status: DC

## 2018-11-18 NOTE — Telephone Encounter (Signed)
[facility] called requesting clarification on sig for Trazodone. Script was sent with 2 sigs, one stating 1 tab QHS and one stating 2-6 tabs QHS  Please advise on correct sig

## 2018-11-18 NOTE — Telephone Encounter (Signed)
Sorry, I will resend for just 1 tab at bedtime.  She used to take more than 1 but more recently she is only been taking 1.

## 2018-11-18 NOTE — Telephone Encounter (Signed)
[facility] advised to discontinue previous RX and fill RX sent today

## 2018-11-21 ENCOUNTER — Other Ambulatory Visit (HOSPITAL_COMMUNITY): Payer: Self-pay | Admitting: *Deleted

## 2018-11-21 DIAGNOSIS — Z1231 Encounter for screening mammogram for malignant neoplasm of breast: Secondary | ICD-10-CM

## 2018-12-08 ENCOUNTER — Other Ambulatory Visit: Payer: Self-pay | Admitting: Family Medicine

## 2019-01-19 ENCOUNTER — Ambulatory Visit: Payer: Self-pay | Admitting: Family Medicine

## 2019-03-05 ENCOUNTER — Ambulatory Visit (HOSPITAL_COMMUNITY)
Admission: RE | Admit: 2019-03-05 | Discharge: 2019-03-05 | Disposition: A | Discharge status: Home / Self Care | Payer: Self-pay | Source: Ambulatory Visit | Attending: Obstetrics and Gynecology | Admitting: Obstetrics and Gynecology

## 2019-03-05 ENCOUNTER — Other Ambulatory Visit: Payer: Self-pay

## 2019-03-05 ENCOUNTER — Encounter (HOSPITAL_COMMUNITY): Payer: Self-pay

## 2019-03-05 ENCOUNTER — Ambulatory Visit
Admission: RE | Admit: 2019-03-05 | Discharge: 2019-03-05 | Disposition: A | Discharge status: Home / Self Care | Payer: No Typology Code available for payment source | Source: Ambulatory Visit | Attending: Obstetrics and Gynecology | Admitting: Obstetrics and Gynecology

## 2019-03-05 DIAGNOSIS — Z1231 Encounter for screening mammogram for malignant neoplasm of breast: Secondary | ICD-10-CM

## 2019-03-05 DIAGNOSIS — Z1239 Encounter for other screening for malignant neoplasm of breast: Secondary | ICD-10-CM | POA: Insufficient documentation

## 2019-03-05 HISTORY — DX: Essential (primary) hypertension: I10

## 2019-03-05 NOTE — Progress Notes (Signed)
No complaints today.   Pap Smear: Pap smear not completed today. Last Pap smear was 08/07/2016 at [facility] and normal with negative HPV. Per patient has no history of an abnormal Pap smear. Last Pap smear result is in Epic.  Physical exam: Breasts Breasts symmetrical. No skin abnormalities bilateral breasts. No nipple retraction bilateral breasts. No nipple discharge bilateral breasts. No lymphadenopathy. No lumps palpated bilateral breasts. No complaints of pain or tenderness on exam. Referred patient to the [facility] for a screening mammogram. Appointment scheduled for Thursday, March 05, 2019 at 1420.        Pelvic/Bimanual No Pap smear completed today since last Pap smear and HPV typing was 08/07/2016. Pap smear not indicated per BCCCP guidelines.   Smoking History: Patient has never smoked.  Patient Navigation: Patient education provided. Access to services provided for patient through [facility] program.    Colorectal Cancer Screening: Patient had a colonoscopy completed 05/10/2008. No complaints today.   Breast and Cervical Cancer Risk Assessment: Patient has a family history of two maternal aunts having breast cancer. Patient has no known genetic mutations or history of radiation treatment to the chest before age 61. Patient has no history of cervical dysplasia, immunocompromised, or DES exposure in-utero.  Risk Assessment    Risk Scores      03/05/2019   Last edited by: Armond Hang, LPN   5-year risk: 1.1 %   Lifetime risk: 5.2 %

## 2019-03-05 NOTE — Patient Instructions (Signed)
Explained breast self awareness with Farris Has. Patient did not need a Pap smear today due to last Pap smear and HPV typing was 08/07/2016. Let her know BCCCP will cover Pap smears and HPV typing every 5 years unless has a history of abnormal Pap smears. Referred patient to the [facility] for a screening mammogram. Appointment scheduled for Thursday, March 05, 2019 at 1420. Patient aware of appointment and will be there. Let patient know [facility] will follow up with her within the next couple weeks with results of mammogram by letter or phone. Farris Has verbalized understanding.  Cire Clute, Arvil Chaco, RN 2:40 PM

## 2019-03-09 ENCOUNTER — Encounter (HOSPITAL_COMMUNITY): Payer: Self-pay | Admitting: *Deleted

## 2019-06-22 ENCOUNTER — Other Ambulatory Visit: Payer: Self-pay | Admitting: *Deleted

## 2019-06-22 MED ORDER — PANTOPRAZOLE SODIUM 40 MG PO TBEC: 40.0000 mg | DELAYED_RELEASE_TABLET | Freq: Every day | ORAL | 3 refills | Status: DC

## 2019-11-11 ENCOUNTER — Telehealth: Payer: Self-pay | Admitting: Family Medicine

## 2019-11-11 DIAGNOSIS — Z1211 Encounter for screening for malignant neoplasm of colon: Secondary | ICD-10-CM

## 2019-11-11 NOTE — Telephone Encounter (Signed)
Referral for [facility]

## 2019-11-11 NOTE — Telephone Encounter (Signed)
Call patient: She is due for colon cancer screening.  She was actually due to go back in 2019 as it had been 10 years since her last one please see if she is okay with scheduling referral or if she already had it done please let us know time location etc.  If she is not interested then we could consider Cologuard etc.

## 2019-11-30 ENCOUNTER — Other Ambulatory Visit: Payer: Self-pay | Admitting: Family Medicine

## 2019-12-07 ENCOUNTER — Other Ambulatory Visit: Payer: Self-pay | Admitting: Family Medicine

## 2019-12-09 ENCOUNTER — Other Ambulatory Visit: Payer: Self-pay | Admitting: Family Medicine

## 2020-01-28 ENCOUNTER — Other Ambulatory Visit: Payer: Self-pay | Admitting: Family Medicine

## 2020-03-03 LAB — HM COLONOSCOPY

## 2020-03-04 ENCOUNTER — Other Ambulatory Visit: Payer: Self-pay | Admitting: Family Medicine

## 2020-03-04 NOTE — Telephone Encounter (Signed)
Per last refill, YOU MUST SCHEDULE AND KEEP AN APPOINTMENT FOR REFILLS.  Please contact patient to schedule an appt.

## 2020-03-08 ENCOUNTER — Other Ambulatory Visit: Payer: Self-pay

## 2020-03-08 MED ORDER — METOPROLOL-HYDROCHLOROTHIAZIDE 100-25 MG PO TABS: 1.0000 | ORAL_TABLET | Freq: Every day | ORAL | 0 refills | Status: DC

## 2020-03-08 NOTE — Telephone Encounter (Signed)
PT scheduled. Out of medication

## 2020-03-14 ENCOUNTER — Encounter: Payer: Self-pay | Admitting: Family Medicine

## 2020-03-14 ENCOUNTER — Other Ambulatory Visit: Payer: Self-pay

## 2020-03-14 ENCOUNTER — Ambulatory Visit (INDEPENDENT_AMBULATORY_CARE_PROVIDER_SITE_OTHER): Payer: 59 | Admitting: Family Medicine

## 2020-03-14 VITALS — BP 138/77 | HR 45 | Ht 61.0 in | Wt 216.0 lb

## 2020-03-14 DIAGNOSIS — F330 Major depressive disorder, recurrent, mild: Secondary | ICD-10-CM

## 2020-03-14 DIAGNOSIS — K219 Gastro-esophageal reflux disease without esophagitis: Secondary | ICD-10-CM

## 2020-03-14 DIAGNOSIS — G43109 Migraine with aura, not intractable, without status migrainosus: Secondary | ICD-10-CM

## 2020-03-14 DIAGNOSIS — Z23 Encounter for immunization: Secondary | ICD-10-CM

## 2020-03-14 DIAGNOSIS — I10 Essential (primary) hypertension: Secondary | ICD-10-CM | POA: Diagnosis not present

## 2020-03-14 DIAGNOSIS — R001 Bradycardia, unspecified: Secondary | ICD-10-CM | POA: Insufficient documentation

## 2020-03-14 LAB — COMPLETE METABOLIC PANEL WITH GFR
AG Ratio: 1.5 (calc) (ref 1.0–2.5)
ALT: 11 U/L (ref 6–29)
AST: 15 U/L (ref 10–35)
Albumin: 4.1 g/dL (ref 3.6–5.1)
Alkaline phosphatase (APISO): 46 U/L (ref 37–153)
BUN: 16 mg/dL (ref 7–25)
CO2: 29 mmol/L (ref 20–32)
Calcium: 9.4 mg/dL (ref 8.6–10.4)
Chloride: 105 mmol/L (ref 98–110)
Creat: 0.84 mg/dL (ref 0.50–0.99)
GFR, Est African American: 86 mL/min/{1.73_m2} (ref >=60)
GFR, Est Non African American: 74 mL/min/{1.73_m2} (ref >=60)
Globulin: 2.7 g/dL (calc) (ref 1.9–3.7)
Glucose, Bld: 87 mg/dL (ref 65–99)
Potassium: 3.8 mmol/L (ref 3.5–5.3)
Sodium: 142 mmol/L (ref 135–146)
Total Bilirubin: 0.4 mg/dL (ref 0.2–1.2)
Total Protein: 6.8 g/dL (ref 6.1–8.1)

## 2020-03-14 LAB — LIPID PANEL
Cholesterol: 201 mg/dL — ABNORMAL HIGH (ref <200)
HDL: 43 mg/dL — ABNORMAL LOW (ref >=50)
LDL Cholesterol (Calc): 135 mg/dL (calc) — ABNORMAL HIGH
Non-HDL Cholesterol (Calc): 158 mg/dL (calc) — ABNORMAL HIGH (ref <130)
Total CHOL/HDL Ratio: 4.7 (calc) (ref <5.0)
Triglycerides: 122 mg/dL (ref <150)

## 2020-03-14 MED ORDER — ALBUTEROL SULFATE HFA 108 (90 BASE) MCG/ACT IN AERS: 2.0000 | INHALATION_SPRAY | Freq: Four times a day (QID) | RESPIRATORY_TRACT | 1 refills | Status: DC | PRN

## 2020-03-14 MED ORDER — ESCITALOPRAM OXALATE 10 MG PO TABS: ORAL_TABLET | ORAL | 4 refills | Status: DC

## 2020-03-14 MED ORDER — PANTOPRAZOLE SODIUM 40 MG PO TBEC: 40.0000 mg | DELAYED_RELEASE_TABLET | Freq: Every day | ORAL | 3 refills | Status: DC

## 2020-03-14 MED ORDER — MELOXICAM 15 MG PO TABS: 15.0000 mg | ORAL_TABLET | Freq: Every day | ORAL | 5 refills | Status: DC

## 2020-03-14 MED ORDER — SUMATRIPTAN SUCCINATE 100 MG PO TABS: ORAL_TABLET | ORAL | 6 refills | Status: DC

## 2020-03-14 MED ORDER — AMLODIPINE BESYLATE 5 MG PO TABS: 5.0000 mg | ORAL_TABLET | Freq: Every day | ORAL | 1 refills | Status: DC

## 2020-03-14 MED ORDER — HYDROCHLOROTHIAZIDE 25 MG PO TABS: 25.0000 mg | ORAL_TABLET | Freq: Every day | ORAL | 3 refills | Status: DC

## 2020-03-14 NOTE — Assessment & Plan Note (Signed)
And her pantoprazole regularly.  Prescription sent to pharmacy.

## 2020-03-14 NOTE — Assessment & Plan Note (Addendum)
Could be secondary to her Toprol 100mg .  Will d/c BB and switch to the amlodipine. F/U in  acouple of week for nurse visit to recheck BP and pulse on new regimen.  Continue the hydrochlorothiazide 25 mg. rec EKG if pulse not improving.  She wanted to hold off on EKG bc of cost today.

## 2020-03-14 NOTE — Assessment & Plan Note (Signed)
He has felt a little bit more down recently she says she really feels like it seasonal she notices around this time a year every year she starts to feel more down she denies feeling anxious or wanting to harm herself.  She is interested in restarting the Lexapro.  Prescription sent to [facility] to continue to take it at least for the winter months.

## 2020-03-14 NOTE — Progress Notes (Signed)
Established Patient Office Visit  Subjective:  Patient ID: Carolyn [name], female    DOB: [birth year]  Age: 64 y.o. MRN: [id number]  CC:  Chief Complaint  Patient presents with  . Hypertension    HPI ALDORA PERMAN presents for   Hypertension- Pt denies chest pain, SOB, dizziness, or heart palpitations.  Taking meds as directed w/o problems.  Denies medication side effects.    Follow-up migraines-overall she is doing well.  Just uses rescue medication.  Asthma for the last couple weeks she has been using her albuterol about once a day she just is felt like a little bit of chest tightness in her upper chest and some intermittent wheezing.   Past Medical History:  Diagnosis Date  . Arthritis    neck  . Asthma    mild persistent  . Constipation   . Depression   . Eczema   . Female bladder prolapse   . Hypertension   . Insomnia   . SUI (stress urinary incontinence, female)     No past surgical history on file.  Family History  Problem Relation Age of Onset  . Hypertension Father   . Hypertension Sister   . Hypertension Sister   . Cancer Other        breast- late 35's    Social History   Socioeconomic History  . Marital status: Married    Spouse name: Not on file  . Number of children: 2  . Years of education: Not on file  . Highest education level: Bachelor's degree (e.g., BA, AB, BS)  Occupational History  . Not on file  Tobacco Use  . Smoking status: Never Smoker  . Smokeless tobacco: Never Used  Vaping Use  . Vaping Use: Never used  Substance and Sexual Activity  . Alcohol use: No  . Drug use: No  . Sexual activity: Not on file  Other Topics Concern  . Not on file  Social History Narrative  . Not on file   Social Determinants of Health   Financial Resource Strain:   . Difficulty of Paying Living Expenses: Not on file  Food Insecurity:   . Worried About Programme researcher, broadcasting/film/video in the Last Year: Not on file  . Ran Out of Food in the Last Year:  Not on file  Transportation Needs:   . Lack of Transportation (Medical): Not on file  . Lack of Transportation (Non-Medical): Not on file  Physical Activity:   . Days of Exercise per Week: Not on file  . Minutes of Exercise per Session: Not on file  Stress:   . Feeling of Stress : Not on file  Social Connections:   . Frequency of Communication with Friends and Family: Not on file  . Frequency of Social Gatherings with Friends and Family: Not on file  . Attends Religious Services: Not on file  . Active Member of Clubs or Organizations: Not on file  . Attends Banker Meetings: Not on file  . Marital Status: Not on file  Intimate Partner Violence:   . Fear of Current or Ex-Partner: Not on file  . Emotionally Abused: Not on file  . Physically Abused: Not on file  . Sexually Abused: Not on file    Outpatient Medications Prior to Visit  Medication Sig Dispense Refill  . traZODone (DESYREL) 50 MG tablet Take 1 tablet (50 mg total) by mouth at bedtime. YOU MUST SCHEDULE AND KEEP APPOINTMENT FOR REFILLS.PLEASE CALL OFFICE TO SCHEDULE. 90  tablet 0  . albuterol (PROAIR HFA) 108 (90 Base) MCG/ACT inhaler Inhale 2 puffs into the lungs every 6 (six) hours as needed for wheezing. 1 Inhaler 2  . meloxicam (MOBIC) 15 MG tablet Take 1 tablet (15 mg total) by mouth daily. 30 tablet 5  . metoprolol-hydrochlorothiazide (LOPRESSOR HCT) 100-25 MG tablet Take 1 tablet by mouth daily. Must keep scheduled appointment. 30 tablet 0  . pantoprazole (PROTONIX) 40 MG tablet Take 1 tablet (40 mg total) by mouth daily. 90 tablet 3  . SUMAtriptan (IMITREX) 100 MG tablet take 1 tablet by mouth AT ONSET OF MIGRAINE MAY REPEAT ONCE AFTER 2 HOURS 8 tablet 6  . escitalopram (LEXAPRO) 10 MG tablet 1/2 tab po QD x 6 days then increase to whole tab daily (Patient not taking: Reported on 03/05/2019) 30 tablet 3   No facility-administered medications prior to visit.    Allergies  Allergen Reactions  . Ace  Inhibitors     REACTION: cough, also with ARBS  . Ciprofloxacin Other (See Comments)    Headache  . Fluoxetine Other (See Comments)    Headaches  . Montelukast Sodium Other (See Comments)    Headache   . Wellbutrin [Bupropion] Palpitations    ROS Review of Systems    Objective:    Physical Exam Constitutional:      Appearance: She is well-developed.  HENT:     Head: Normocephalic and atraumatic.  Cardiovascular:     Rate and Rhythm: Normal rate and regular rhythm.     Heart sounds: Normal heart sounds.  Pulmonary:     Effort: Pulmonary effort is normal.     Breath sounds: Normal breath sounds.  Skin:    General: Skin is warm and dry.  Neurological:     Mental Status: She is alert and oriented to person, place, and time.  Psychiatric:        Behavior: Behavior normal.     BP 138/77   Pulse (!) 45   Ht 5\' 1"  (1.549 m)   Wt 216 lb (98 kg)   LMP 07/07/2012   SpO2 96%   BMI 40.81 kg/m  Wt Readings from Last 3 Encounters:  03/14/20 216 lb (98 kg)  03/05/19 207 lb (93.9 kg)  09/05/17 185 lb (83.9 kg)     Health Maintenance Due  Topic Date Due  . COLONOSCOPY  05/10/2018    There are no preventive care reminders to display for this patient.  Lab Results  Component Value Date   TSH 0.35 (L) 11/17/2018   Lab Results  Component Value Date   WBC 5.0 08/07/2016   HGB 13.2 08/07/2016   HCT 41.5 08/07/2016   MCV 83.7 08/07/2016   PLT 280 08/07/2016   Lab Results  Component Value Date   NA 139 11/17/2018   K 4.0 11/17/2018   CO2 27 11/17/2018   GLUCOSE 86 11/17/2018   BUN 12 11/17/2018   CREATININE 0.88 11/17/2018   BILITOT 0.4 11/17/2018   ALKPHOS 46 08/07/2016   AST 20 11/17/2018   ALT 17 11/17/2018   PROT 7.4 11/17/2018   ALBUMIN 4.0 08/07/2016   CALCIUM 9.5 11/17/2018   Lab Results  Component Value Date   CHOL 217 (H) 11/17/2018   Lab Results  Component Value Date   HDL 49 (L) 11/17/2018   Lab Results  Component Value Date   LDLCALC  145 (H) 11/17/2018   Lab Results  Component Value Date   TRIG 114 11/17/2018   Lab Results  Component Value Date   CHOLHDL 4.4 11/17/2018   No results found for: HGBA1C    Assessment & Plan:   Problem List Items Addressed This Visit      Cardiovascular and Mediastinum   Migraine with aura    Well controlled. Continue current regimen. Follow up in  6 mo      Relevant Medications   meloxicam (MOBIC) 15 MG tablet   amLODipine (NORVASC) 5 MG tablet   hydrochlorothiazide (HYDRODIURIL) 25 MG tablet   escitalopram (LEXAPRO) 10 MG tablet   SUMAtriptan (IMITREX) 100 MG tablet   HYPERTENSION, BENIGN SYSTEMIC - Primary    Well controlled.. Follow up in  6 mo. Though she has bradycardia today so will d/c metoprolol and change to amlodipine.        Relevant Medications   amLODipine (NORVASC) 5 MG tablet   hydrochlorothiazide (HYDRODIURIL) 25 MG tablet   Other Relevant Orders   COMPLETE METABOLIC PANEL WITH GFR   Lipid panel     Digestive   GASTROESOPHAGEAL REFLUX, NO ESOPHAGITIS    And her pantoprazole regularly.  Prescription sent to pharmacy.      Relevant Medications   pantoprazole (PROTONIX) 40 MG tablet     Other   Major depressive disorder, recurrent episode (HCC)    He has felt a little bit more down recently she says she really feels like it seasonal she notices around this time a year every year she starts to feel more down she denies feeling anxious or wanting to harm herself.  She is interested in restarting the Lexapro.  Prescription sent to [facility] to continue to take it at least for the winter months.      Relevant Medications   escitalopram (LEXAPRO) 10 MG tablet   Bradycardia    Could be secondary to her Toprol 100mg .  Will d/c BB and switch to the amlodipine. F/U in  acouple of week for nurse visit to recheck BP and pulse on new regimen.  Continue the hydrochlorothiazide 25 mg. rec EKG if pulse not improving.  She wanted to hold off on EKG bc of  cost today.        Other Visit Diagnoses    Need for immunization against influenza       Relevant Orders   Flu Vaccine QUAD 36+ mos IM (Completed)      Meds ordered this encounter  Medications  . albuterol (PROAIR HFA) 108 (90 Base) MCG/ACT inhaler    Sig: Inhale 2 puffs into the lungs every 6 (six) hours as needed for wheezing.    Dispense:  18 g    Refill:  1  . meloxicam (MOBIC) 15 MG tablet    Sig: Take 1 tablet (15 mg total) by mouth daily.    Dispense:  30 tablet    Refill:  5  . pantoprazole (PROTONIX) 40 MG tablet    Sig: Take 1 tablet (40 mg total) by mouth daily.    Dispense:  90 tablet    Refill:  3  . amLODipine (NORVASC) 5 MG tablet    Sig: Take 1 tablet (5 mg total) by mouth daily.    Dispense:  90 tablet    Refill:  1  . hydrochlorothiazide (HYDRODIURIL) 25 MG tablet    Sig: Take 1 tablet (25 mg total) by mouth daily.    Dispense:  90 tablet    Refill:  3  . escitalopram (LEXAPRO) 10 MG tablet    Sig: 1/2 tab po QD x  6 days then increase to whole tab daily    Dispense:  30 tablet    Refill:  4  . SUMAtriptan (IMITREX) 100 MG tablet    Sig: take 1 tablet by mouth AT ONSET OF MIGRAINE MAY REPEAT ONCE AFTER 2 HOURS    Dispense:  8 tablet    Refill:  6    Follow-up: Return in about 4 weeks (around 04/11/2020) for Hypertension, nurse visit.    Nani Gasser, MD

## 2020-03-14 NOTE — Assessment & Plan Note (Signed)
Well controlled. Continue current regimen. Follow up in  6 mo  

## 2020-03-14 NOTE — Assessment & Plan Note (Signed)
Well controlled.. Follow up in  6 mo. Though she has bradycardia today so will d/c metoprolol and change to amlodipine.

## 2020-04-10 ENCOUNTER — Other Ambulatory Visit: Payer: Self-pay | Admitting: Family Medicine

## 2020-04-11 ENCOUNTER — Ambulatory Visit (INDEPENDENT_AMBULATORY_CARE_PROVIDER_SITE_OTHER): Payer: 59 | Admitting: Osteopathic Medicine

## 2020-04-11 ENCOUNTER — Other Ambulatory Visit: Payer: Self-pay

## 2020-04-11 VITALS — BP 130/80 | HR 64 | Ht 61.0 in | Wt 217.0 lb

## 2020-04-11 DIAGNOSIS — I10 Essential (primary) hypertension: Secondary | ICD-10-CM | POA: Diagnosis not present

## 2020-04-11 NOTE — Progress Notes (Signed)
   Subjective:    Patient ID: Carolyn [name], female    DOB: 1955-06-29, 64 y.o.   MRN: [id number]  HPI Patient presents to office to follow up on blood pressure and low heart rate. Metoprolol was stopped at last visit and patient was switched to amlodipine. Declines any complications or swelling.    Review of Systems     Objective:   Physical Exam        Assessment & Plan:  Blood pressure was well controlled today at 130/80 and pulse was 64. Patient satisfied with new medication regimen. No significant weight gain (1 pound from last visit). Advised patient to continue current regimen and we would call if any changes.

## 2020-06-21 ENCOUNTER — Other Ambulatory Visit: Payer: Self-pay | Admitting: Family Medicine

## 2020-07-29 LAB — HM MAMMOGRAPHY

## 2020-08-25 ENCOUNTER — Encounter: Payer: Self-pay | Admitting: Family Medicine

## 2020-09-23 ENCOUNTER — Other Ambulatory Visit: Payer: Self-pay | Admitting: Family Medicine

## 2020-09-27 ENCOUNTER — Other Ambulatory Visit: Payer: Self-pay | Admitting: Family Medicine

## 2020-09-28 ENCOUNTER — Other Ambulatory Visit: Payer: Self-pay

## 2020-09-28 DIAGNOSIS — F330 Major depressive disorder, recurrent, mild: Secondary | ICD-10-CM

## 2020-09-28 MED ORDER — TRAZODONE HCL 50 MG PO TABS: 50.0000 mg | ORAL_TABLET | Freq: Every day | ORAL | 0 refills | Status: DC

## 2020-12-17 IMAGING — MG MM DIGITAL SCREENING BILAT W/ TOMO W/ CAD
8 series · 8 of 24 positions shown · non-contrast
Comparison: None.

CLINICAL DATA: Screening.

EXAM:
DIGITAL SCREENING BILATERAL MAMMOGRAM WITH TOMO AND CAD

[L MLO synth-2D]
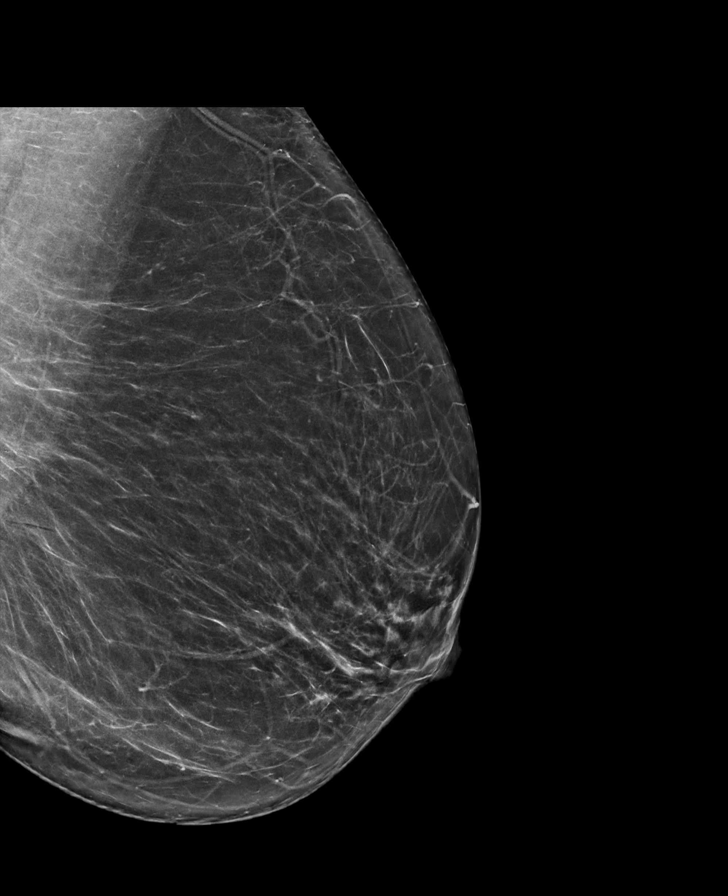

[R CC synth-2D]
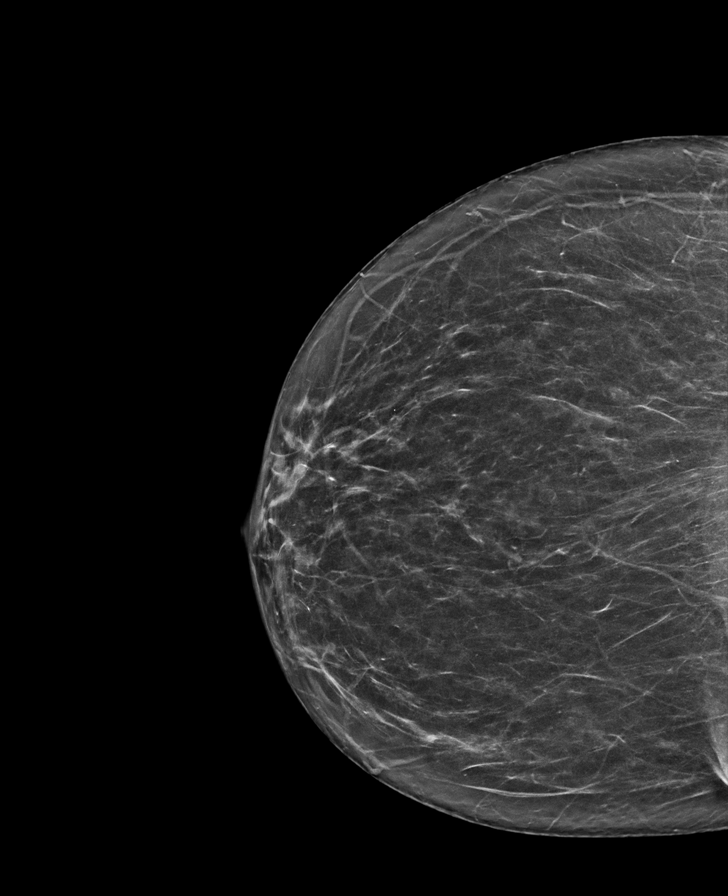

[R MLO synth-2D]
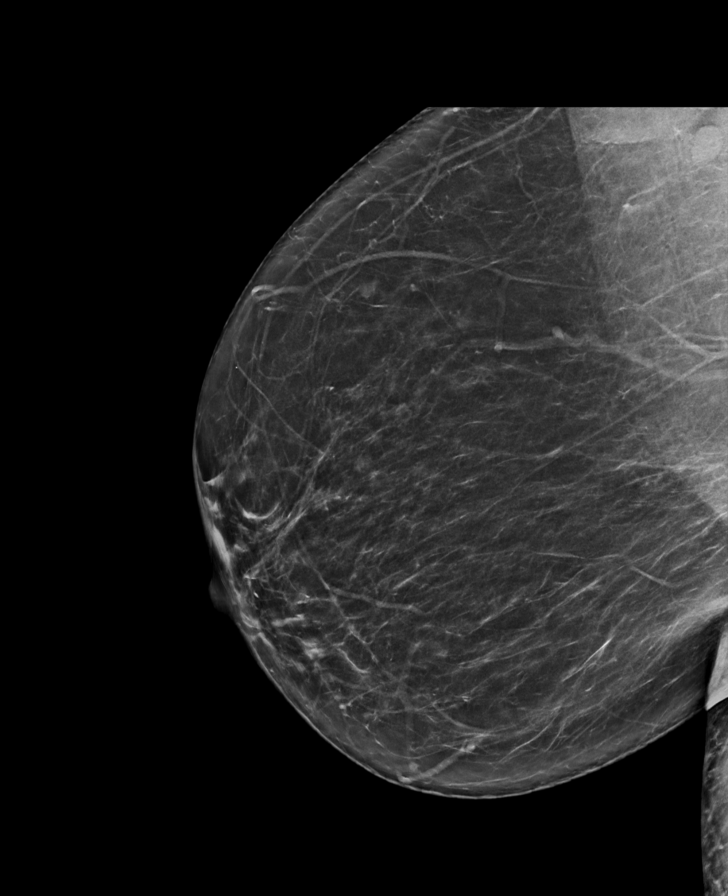

[L CC synth-2D]
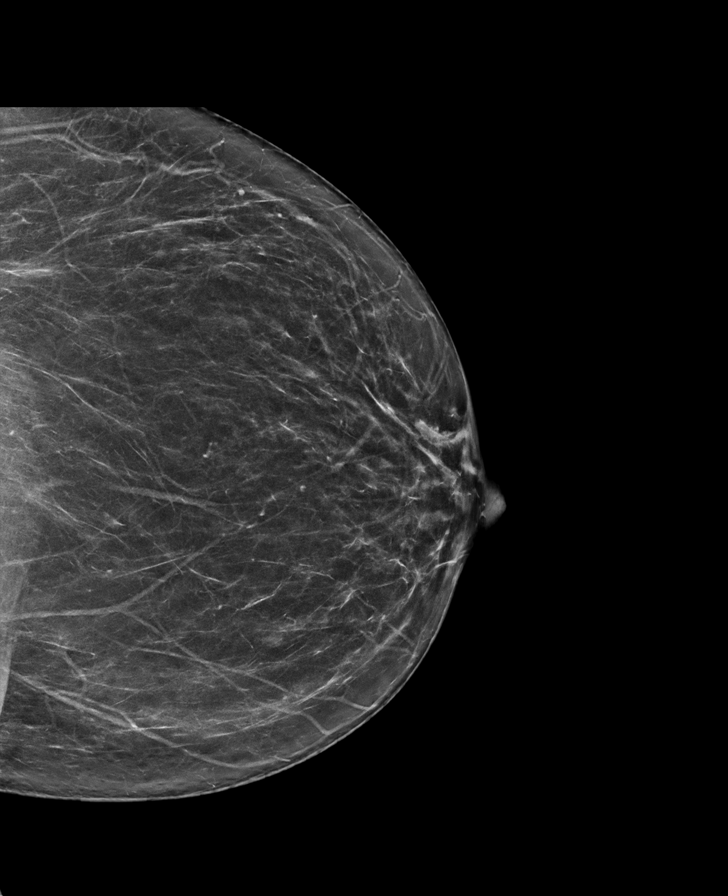

[R CC tomo · tomo slice 34/67.0]
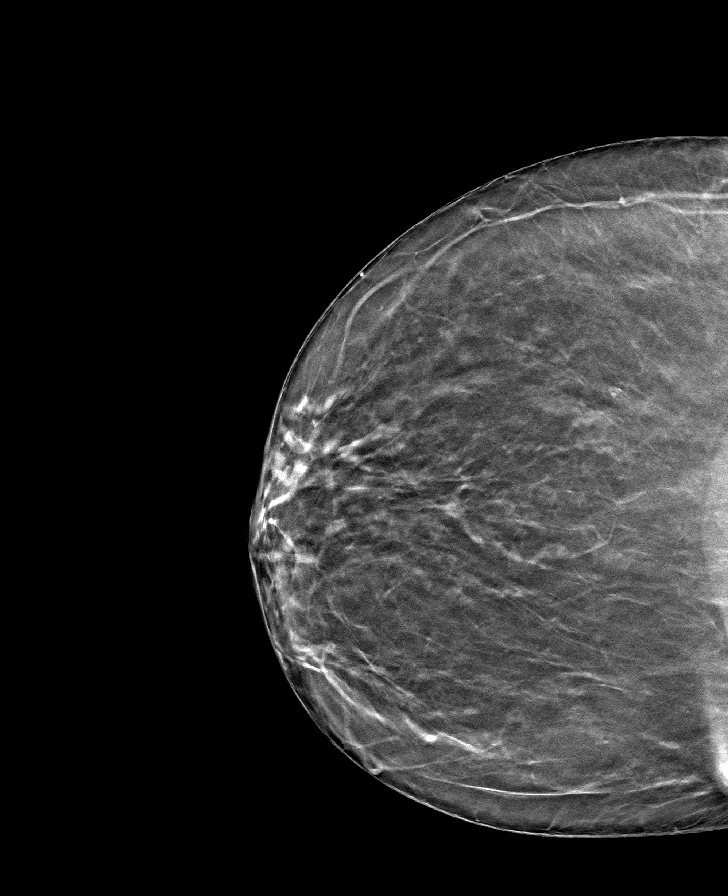

[R MLO tomo · tomo slice 37/72.0]
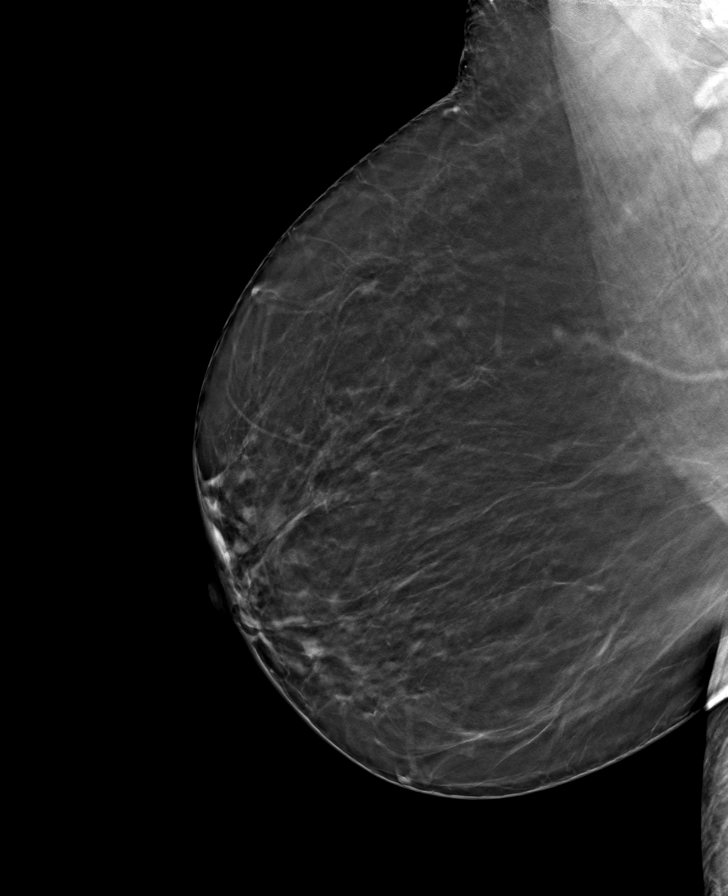

[L MLO tomo · tomo slice 39/76.0]
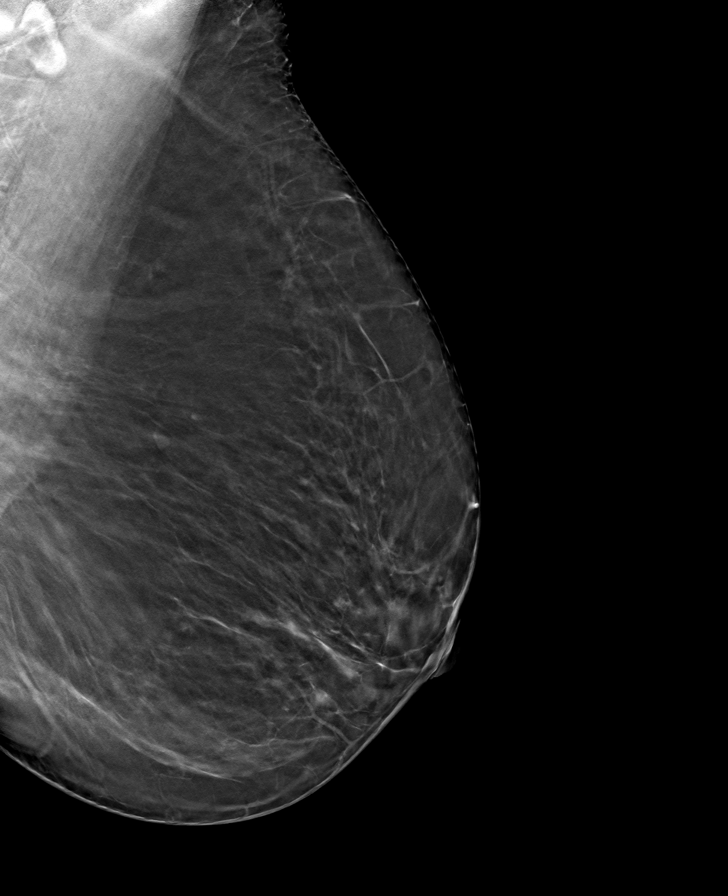

[L CC tomo · tomo slice 36/71.0]
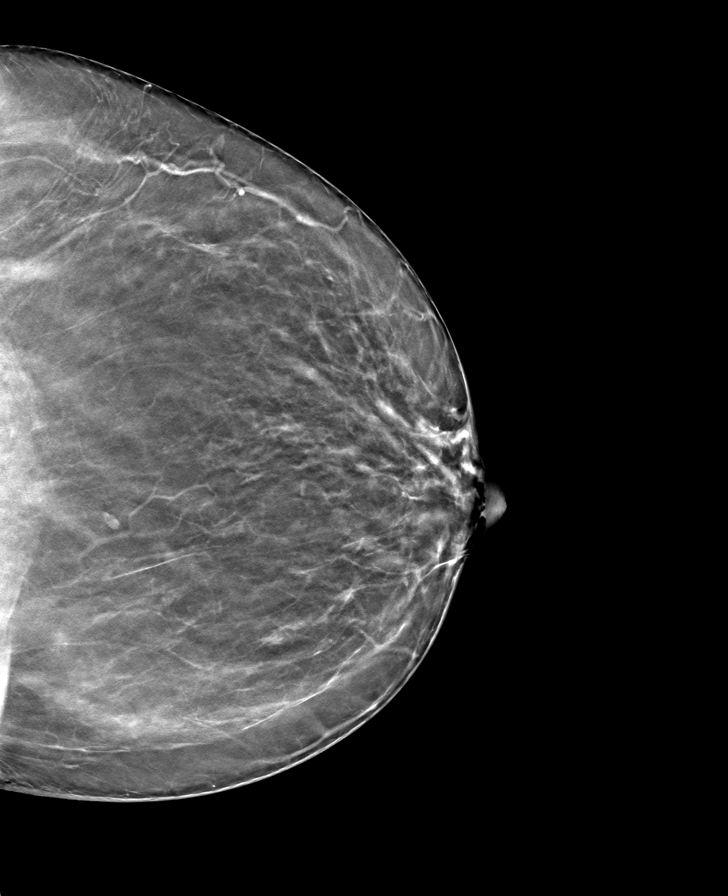

[8 of 24 positions shown; findings below may reference images not displayed]

ACR Breast Density Category b: There are scattered areas of
fibroglandular density.
FINDINGS: There are no findings suspicious for malignancy. Images were
processed with CAD.
IMPRESSION: No mammographic evidence of malignancy. A result letter of this
screening mammogram will be mailed directly to the patient.

RECOMMENDATION:
Screening mammogram in one year. (Code:[id number]-G-EJ6)

BI-RADS CATEGORY  1: Negative.

## 2021-03-27 ENCOUNTER — Other Ambulatory Visit: Payer: Self-pay | Admitting: *Deleted

## 2021-03-27 DIAGNOSIS — F330 Major depressive disorder, recurrent, mild: Secondary | ICD-10-CM

## 2021-03-27 MED ORDER — TRAZODONE HCL 50 MG PO TABS: 50.0000 mg | ORAL_TABLET | Freq: Every day | ORAL | 0 refills | Status: DC

## 2021-04-12 ENCOUNTER — Other Ambulatory Visit: Payer: Self-pay | Admitting: Family Medicine

## 2021-04-12 NOTE — Telephone Encounter (Signed)
LVM for patient to call back to get f/u appt with labs scheduled. AM

## 2021-04-12 NOTE — Telephone Encounter (Signed)
Please call pt and advise her that it has been 1 year since she has been seen. She will need an appt with labs for her refills. Thanks.

## 2021-04-13 ENCOUNTER — Other Ambulatory Visit: Payer: Self-pay

## 2021-04-13 ENCOUNTER — Ambulatory Visit (INDEPENDENT_AMBULATORY_CARE_PROVIDER_SITE_OTHER): Payer: 59 | Admitting: Family Medicine

## 2021-04-13 ENCOUNTER — Encounter: Payer: Self-pay | Admitting: Family Medicine

## 2021-04-13 VITALS — BP 127/73 | HR 59 | Ht 61.0 in | Wt 209.0 lb

## 2021-04-13 DIAGNOSIS — I10 Essential (primary) hypertension: Secondary | ICD-10-CM

## 2021-04-13 DIAGNOSIS — G43109 Migraine with aura, not intractable, without status migrainosus: Secondary | ICD-10-CM

## 2021-04-13 DIAGNOSIS — Z23 Encounter for immunization: Secondary | ICD-10-CM

## 2021-04-13 DIAGNOSIS — F330 Major depressive disorder, recurrent, mild: Secondary | ICD-10-CM | POA: Diagnosis not present

## 2021-04-13 DIAGNOSIS — M542 Cervicalgia: Secondary | ICD-10-CM

## 2021-04-13 DIAGNOSIS — G8929 Other chronic pain: Secondary | ICD-10-CM

## 2021-04-13 MED ORDER — AMLODIPINE BESYLATE 5 MG PO TABS: 5.0000 mg | ORAL_TABLET | Freq: Every day | ORAL | 1 refills | Status: DC

## 2021-04-13 MED ORDER — PANTOPRAZOLE SODIUM 40 MG PO TBEC: 40.0000 mg | DELAYED_RELEASE_TABLET | Freq: Every day | ORAL | 3 refills | Status: DC

## 2021-04-13 MED ORDER — ALBUTEROL SULFATE HFA 108 (90 BASE) MCG/ACT IN AERS: 2.0000 | INHALATION_SPRAY | Freq: Four times a day (QID) | RESPIRATORY_TRACT | 1 refills | Status: DC | PRN

## 2021-04-13 MED ORDER — DULOXETINE HCL 30 MG PO CPEP: 30.0000 mg | ORAL_CAPSULE | Freq: Every day | ORAL | 1 refills | Status: DC

## 2021-04-13 MED ORDER — HYDROCHLOROTHIAZIDE 25 MG PO TABS: 25.0000 mg | ORAL_TABLET | Freq: Every day | ORAL | 1 refills | Status: DC

## 2021-04-13 MED ORDER — SUMATRIPTAN SUCCINATE 100 MG PO TABS: ORAL_TABLET | ORAL | 6 refills | Status: AC

## 2021-04-13 NOTE — Assessment & Plan Note (Signed)
Well controlled. Continue current regimen. Follow up in  6 mo  

## 2021-04-13 NOTE — Assessment & Plan Note (Signed)
Discussed options.  We will taper off of the Lexapro and switch to Cymbalta instead.  I think this could also help with her chronic cervical disc pain.

## 2021-04-13 NOTE — Progress Notes (Signed)
Established Patient Office Visit  Subjective:  Patient ID: Carolyn [name], female    DOB: December 06, 1955  Age: 65 y.o. MRN: [id number]  CC:  Chief Complaint  Patient presents with   Medication Refill    HPI [name] presents for   Hypertension- Pt denies chest pain, SOB, dizziness, or heart palpitations.  Taking meds as directed w/o problems.  Denies medication side effects.    F/U Migraines - she is doing well.    Follow-up major depressive disorder-she feels like the Lexapro makes her bones hurt and would like to consider trying something else.  She said the NSAID also does not help.    Past Medical History:  Diagnosis Date   Arthritis    neck   Asthma    mild persistent   Constipation    Depression    Eczema    Female bladder prolapse    Hypertension    Insomnia    SUI (stress urinary incontinence, female)     No past surgical history on file.  Family History  Problem Relation Age of Onset   Hypertension Father    Hypertension Sister    Hypertension Sister    Cancer Other        breast- late 56's    Social History   Socioeconomic History   Marital status: Married    Spouse name: Not on file   Number of children: 2   Years of education: Not on file   Highest education level: Bachelor's degree (e.g., BA, AB, BS)  Occupational History   Not on file  Tobacco Use   Smoking status: Never   Smokeless tobacco: Never  Vaping Use   Vaping Use: Never used  Substance and Sexual Activity   Alcohol use: No   Drug use: No   Sexual activity: Not on file  Other Topics Concern   Not on file  Social History Narrative   Not on file   Social Determinants of Health   Financial Resource Strain: Not on file  Food Insecurity: Not on file  Transportation Needs: Not on file  Physical Activity: Not on file  Stress: Not on file  Social Connections: Not on file  Intimate Partner Violence: Not on file    Outpatient Medications Prior to Visit  Medication Sig  Dispense Refill   traZODone (DESYREL) 50 MG tablet Take 1 tablet (50 mg total) by mouth at bedtime. 30 day supply given.Appointment must be scheduled and kept for any refills. 30 tablet 0   albuterol (PROAIR HFA) 108 (90 Base) MCG/ACT inhaler Inhale 2 puffs into the lungs every 6 (six) hours as needed for wheezing. 18 g 1   amLODipine (NORVASC) 5 MG tablet Take 1 tablet (5 mg total) by mouth daily. 30 day supply sent. Refills require an appointment and labs 30 tablet 0   escitalopram (LEXAPRO) 10 MG tablet 1/2 tab po QD x 6 days then increase to whole tab daily 30 tablet 4   hydrochlorothiazide (HYDRODIURIL) 25 MG tablet Take 1 tablet (25 mg total) by mouth daily. 30 day supply sent. Refills require an appointment and labs 30 tablet 0   meloxicam (MOBIC) 15 MG tablet Take 1 tablet (15 mg total) by mouth daily. 30 tablet 5   pantoprazole (PROTONIX) 40 MG tablet Take 1 tablet (40 mg total) by mouth daily. 90 tablet 3   SUMAtriptan (IMITREX) 100 MG tablet take 1 tablet by mouth AT ONSET OF MIGRAINE MAY REPEAT ONCE AFTER 2 HOURS 8  tablet 6   No facility-administered medications prior to visit.    Allergies  Allergen Reactions   Ace Inhibitors     REACTION: cough, also with [facility]   Ciprofloxacin Other (See Comments)    Headache   Fluoxetine Other (See Comments)    Headaches   Montelukast Sodium Other (See Comments)    Headache    Wellbutrin [Bupropion] Palpitations    ROS Review of Systems    Objective:    Physical Exam Constitutional:      Appearance: Normal appearance. She is well-developed.  HENT:     Head: Normocephalic and atraumatic.  Cardiovascular:     Rate and Rhythm: Normal rate and regular rhythm.     Heart sounds: Normal heart sounds.  Pulmonary:     Effort: Pulmonary effort is normal.     Breath sounds: Normal breath sounds.  Skin:    General: Skin is warm and dry.  Neurological:     Mental Status: She is alert and oriented to person, place, and time.   Psychiatric:        Behavior: Behavior normal.    BP 127/73   Pulse (!) 59   Ht 5\' 1"  (1.549 m)   Wt 209 lb (94.8 kg)   LMP 07/07/2012   SpO2 100%   BMI 39.49 kg/m  Wt Readings from Last 3 Encounters:  04/13/21 209 lb (94.8 kg)  04/11/20 217 lb (98.4 kg)  03/14/20 216 lb (98 kg)     Health Maintenance Due  Topic Date Due   Pneumococcal Vaccine 30-29 Years old (1 - PCV) Never done   Zoster Vaccines- Shingrix (1 of 2) Never done   COVID-19 Vaccine (2 - Booster for Janssen series) 10/24/2019    There are no preventive care reminders to display for this patient.  Lab Results  Component Value Date   TSH 0.35 (L) 11/17/2018   Lab Results  Component Value Date   WBC 5.0 08/07/2016   HGB 13.2 08/07/2016   HCT 41.5 08/07/2016   MCV 83.7 08/07/2016   PLT 280 08/07/2016   Lab Results  Component Value Date   NA 142 03/14/2020   K 3.8 03/14/2020   CO2 29 03/14/2020   GLUCOSE 87 03/14/2020   BUN 16 03/14/2020   CREATININE 0.84 03/14/2020   BILITOT 0.4 03/14/2020   ALKPHOS 46 08/07/2016   AST 15 03/14/2020   ALT 11 03/14/2020   PROT 6.8 03/14/2020   ALBUMIN 4.0 08/07/2016   CALCIUM 9.4 03/14/2020   Lab Results  Component Value Date   CHOL 201 (H) 03/14/2020   Lab Results  Component Value Date   HDL 43 (L) 03/14/2020   Lab Results  Component Value Date   LDLCALC 135 (H) 03/14/2020   Lab Results  Component Value Date   TRIG 122 03/14/2020   Lab Results  Component Value Date   CHOLHDL 4.7 03/14/2020   No results found for: HGBA1C    Assessment & Plan:   Problem List Items Addressed This Visit       Cardiovascular and Mediastinum   Migraine with aura    Doing well overall just uses Imitrex as needed.      Relevant Medications   SUMAtriptan (IMITREX) 100 MG tablet   hydrochlorothiazide (HYDRODIURIL) 25 MG tablet   amLODipine (NORVASC) 5 MG tablet   DULoxetine (CYMBALTA) 30 MG capsule   HYPERTENSION, BENIGN SYSTEMIC - Primary    Well  controlled. Continue current regimen. Follow up in  6 mo  Relevant Medications   hydrochlorothiazide (HYDRODIURIL) 25 MG tablet   amLODipine (NORVASC) 5 MG tablet   Other Relevant Orders   Lipid panel   COMPLETE METABOLIC PANEL WITH GFR   TSH     Other   Major depressive disorder, recurrent episode (HCC)    Discussed options.  We will taper off of the Lexapro and switch to Cymbalta instead.  I think this could also help with her chronic cervical disc pain.      Relevant Medications   DULoxetine (CYMBALTA) 30 MG capsule   Chronic cervical pain    Meloxicam does not seem to be helping much so we will try switching her to Cymbalta.      Relevant Medications   DULoxetine (CYMBALTA) 30 MG capsule   Other Visit Diagnoses     Need for immunization against influenza       Relevant Orders   Flu Vaccine QUAD 90mo+IM (Fluarix, Fluzone & Alfiuria Quad PF) (Completed)       Meds ordered this encounter  Medications   SUMAtriptan (IMITREX) 100 MG tablet    Sig: take 1 tablet by mouth AT ONSET OF MIGRAINE MAY REPEAT ONCE AFTER 2 HOURS    Dispense:  8 tablet    Refill:  6   pantoprazole (PROTONIX) 40 MG tablet    Sig: Take 1 tablet (40 mg total) by mouth daily.    Dispense:  90 tablet    Refill:  3   hydrochlorothiazide (HYDRODIURIL) 25 MG tablet    Sig: Take 1 tablet (25 mg total) by mouth daily.    Dispense:  90 tablet    Refill:  1   amLODipine (NORVASC) 5 MG tablet    Sig: Take 1 tablet (5 mg total) by mouth daily.    Dispense:  90 tablet    Refill:  1   albuterol (PROAIR HFA) 108 (90 Base) MCG/ACT inhaler    Sig: Inhale 2 puffs into the lungs every 6 (six) hours as needed for wheezing.    Dispense:  18 g    Refill:  1   DULoxetine (CYMBALTA) 30 MG capsule    Sig: Take 1 capsule (30 mg total) by mouth daily.    Dispense:  90 capsule    Refill:  1    Follow-up: Return in about 3 months (around 07/14/2021) for New start medication.    Nani Gasser, MD

## 2021-04-13 NOTE — Assessment & Plan Note (Signed)
Doing well overall just uses Imitrex as needed.

## 2021-04-13 NOTE — Assessment & Plan Note (Signed)
Meloxicam does not seem to be helping much so we will try switching her to Cymbalta.

## 2021-04-13 NOTE — Progress Notes (Signed)
Pt would like to try something else for depression and pain. She stated that the Lexapro causes her "bones to hurt" and the Meloxicam is not as effective for her pain.

## 2021-04-13 NOTE — Patient Instructions (Signed)
Please cut your Lexapro in half and take a half of a tab daily for 10 days and then stop.  Next date you can start the new medication..  You can then switch to the duloxetine which is the generic name for the new medication also called Cymbalta.

## 2021-04-14 LAB — COMPLETE METABOLIC PANEL WITH GFR
AG Ratio: 1.7 (calc) (ref 1.0–2.5)
ALT: 15 U/L (ref 6–29)
AST: 16 U/L (ref 10–35)
Albumin: 4.4 g/dL (ref 3.6–5.1)
Alkaline phosphatase (APISO): 53 U/L (ref 37–153)
BUN: 15 mg/dL (ref 7–25)
CO2: 27 mmol/L (ref 20–32)
Calcium: 9.6 mg/dL (ref 8.6–10.4)
Chloride: 106 mmol/L (ref 98–110)
Creat: 0.8 mg/dL (ref 0.50–1.05)
Globulin: 2.6 g/dL (calc) (ref 1.9–3.7)
Glucose, Bld: 89 mg/dL (ref 65–99)
Potassium: 3.9 mmol/L (ref 3.5–5.3)
Sodium: 141 mmol/L (ref 135–146)
Total Bilirubin: 0.4 mg/dL (ref 0.2–1.2)
Total Protein: 7 g/dL (ref 6.1–8.1)
eGFR: 83 mL/min/{1.73_m2} (ref >=60)

## 2021-04-14 LAB — LIPID PANEL
Cholesterol: 226 mg/dL — ABNORMAL HIGH (ref <200)
HDL: 45 mg/dL — ABNORMAL LOW (ref >=50)
LDL Cholesterol (Calc): 157 mg/dL (calc) — ABNORMAL HIGH
Non-HDL Cholesterol (Calc): 181 mg/dL (calc) — ABNORMAL HIGH (ref <130)
Total CHOL/HDL Ratio: 5 (calc) — ABNORMAL HIGH (ref <5.0)
Triglycerides: 118 mg/dL (ref <150)

## 2021-04-14 LAB — TSH: TSH: 0.77 mIU/L (ref 0.40–4.50)

## 2021-04-14 NOTE — Progress Notes (Signed)
Hi Costella,  Your cholesterol is up from last time.  Encourage you to work on healthy diet and regular exercise. Your metabolic panel and thyroid look good.

## 2021-05-03 ENCOUNTER — Other Ambulatory Visit: Payer: Self-pay | Admitting: Family Medicine

## 2021-05-03 DIAGNOSIS — F330 Major depressive disorder, recurrent, mild: Secondary | ICD-10-CM

## 2021-08-29 ENCOUNTER — Other Ambulatory Visit: Payer: Self-pay | Admitting: Family Medicine

## 2021-08-29 DIAGNOSIS — F330 Major depressive disorder, recurrent, mild: Secondary | ICD-10-CM

## 2021-09-06 ENCOUNTER — Other Ambulatory Visit: Payer: Self-pay

## 2021-09-06 DIAGNOSIS — F330 Major depressive disorder, recurrent, mild: Secondary | ICD-10-CM

## 2021-09-06 MED ORDER — TRAZODONE HCL 50 MG PO TABS: ORAL_TABLET | ORAL | 0 refills | Status: DC

## 2021-10-10 LAB — HM MAMMOGRAPHY

## 2021-12-20 ENCOUNTER — Other Ambulatory Visit: Payer: Self-pay | Admitting: Family Medicine

## 2021-12-20 DIAGNOSIS — F330 Major depressive disorder, recurrent, mild: Secondary | ICD-10-CM

## 2021-12-20 DIAGNOSIS — G8929 Other chronic pain: Secondary | ICD-10-CM

## 2022-02-06 ENCOUNTER — Other Ambulatory Visit: Payer: Self-pay | Admitting: Family Medicine

## 2022-02-06 DIAGNOSIS — F330 Major depressive disorder, recurrent, mild: Secondary | ICD-10-CM

## 2022-07-19 ENCOUNTER — Encounter: Payer: Self-pay | Admitting: Family Medicine

## 2022-07-19 ENCOUNTER — Ambulatory Visit (INDEPENDENT_AMBULATORY_CARE_PROVIDER_SITE_OTHER): Payer: No Typology Code available for payment source | Admitting: Family Medicine

## 2022-07-19 VITALS — BP 142/85 | HR 60 | Temp 98.2°F | Ht 61.0 in | Wt 220.0 lb

## 2022-07-19 DIAGNOSIS — N3281 Overactive bladder: Secondary | ICD-10-CM | POA: Diagnosis not present

## 2022-07-19 DIAGNOSIS — M545 Low back pain, unspecified: Secondary | ICD-10-CM | POA: Diagnosis not present

## 2022-07-19 DIAGNOSIS — M2550 Pain in unspecified joint: Secondary | ICD-10-CM

## 2022-07-19 DIAGNOSIS — R1032 Left lower quadrant pain: Secondary | ICD-10-CM | POA: Diagnosis not present

## 2022-07-19 DIAGNOSIS — R350 Frequency of micturition: Secondary | ICD-10-CM | POA: Diagnosis not present

## 2022-07-19 DIAGNOSIS — Z8261 Family history of arthritis: Secondary | ICD-10-CM | POA: Diagnosis not present

## 2022-07-19 LAB — POCT URINALYSIS DIP (CLINITEK)
Bilirubin, UA: NEGATIVE
Blood, UA: NEGATIVE
Glucose, UA: NEGATIVE mg/dL
Ketones, POC UA: NEGATIVE mg/dL
Nitrite, UA: NEGATIVE
POC PROTEIN,UA: NEGATIVE
Spec Grav, UA: 1.015 (ref 1.010–1.025)
Urobilinogen, UA: 1 E.U./dL
pH, UA: 7.5 (ref 5.0–8.0)

## 2022-07-19 NOTE — Progress Notes (Signed)
Acute Office Visit  Subjective:     Patient ID: Carolyn [name], female    DOB: Oct 28, 1955, 67 y.o.   MRN: [id number]  Chief Complaint  Patient presents with   Back Pain    Abdominal pain     HPI Patient is in today for low back and abdominal pain. 3 weeks of low back and lower ad pain.  The back pain is mostly over the mid low back going towards the left.  The abdominal pain is over the lower half the abdomen on the right and the left side below the umbilicus.  The pain has been waxing and waning and is not constant.  Worse with waking . + frequency of urination.  Reports that she has had some subjective fevers on and off for the last 2 weeks.  As well as a change in color of her stools she tends to have issues with constipation.  No blood in the stool.  No dysuria.  Report occasional incontinence where she will get an urge to go and then has a hard time making it to the bathroom.  This is not new and has been going on for quite some time.  She occasionally will have a little bit of leakage with coughing or sneezing but not frequently.  ROS      Objective:    BP (!) 142/85   Pulse 60   Temp 98.2 F (36.8 C) (Oral)   Ht 5\' 1"  (1.549 m)   Wt 220 lb (99.8 kg)   LMP 07/07/2012   SpO2 96%   BMI 41.57 kg/m    Physical Exam Vitals and nursing note reviewed.  Constitutional:      Appearance: She is well-developed.  HENT:     Head: Normocephalic and atraumatic.  Pulmonary:     Effort: Pulmonary effort is normal.  Abdominal:     Tenderness: There is no guarding or rebound.     Comments: Hypoactive BS. Tender in the LLQ and RUQ.   Skin:    General: Skin is warm and dry.  Neurological:     Mental Status: She is alert and oriented to person, place, and time.  Psychiatric:        Behavior: Behavior normal.     Results for orders placed or performed in visit on 07/19/22  HM MAMMOGRAPHY  Result Value Ref Range   HM Mammogram 0-4 Bi-Rad 0-4 Bi-Rad, Self Reported Normal   POCT URINALYSIS DIP (CLINITEK)  Result Value Ref Range   Color, UA yellow yellow   Clarity, UA cloudy (A) clear   Glucose, UA negative negative mg/dL   Bilirubin, UA negative negative   Ketones, POC UA negative negative mg/dL   Spec Grav, UA 1.015 1.010 - 1.025   Blood, UA negative negative   pH, UA 7.5 5.0 - 8.0   POC PROTEIN,UA negative negative, trace   Urobilinogen, UA 1.0 0.2 or 1.0 E.U./dL   Nitrite, UA Negative Negative   Leukocytes, UA Small (1+) (A) Negative        Assessment & Plan:   Problem List Items Addressed This Visit       Genitourinary   OAB (overactive bladder)    Sounds like this is a separate and more longstanding issue.  We discussed doing Kegel exercises as well as frequent bathroom breaks and emptying the bladder regularly before leaving the house and when getting to a location where she may stay for an extended amount of time.  We also discussed  that there are medications on the market that can help with overactive bladder by reducing frequency and urgency.  She is open to trying one of the medications.  But I would like to workup the abdominal pain and low back pain first before we start a new medication.      Other Visit Diagnoses     Urinary frequency    -  Primary   Relevant Orders   POCT URINALYSIS DIP (CLINITEK) (Completed)   Urine Culture   CT Abdomen Pelvis W Contrast   Acute bilateral low back pain without sciatica       Relevant Medications   meloxicam (MOBIC) 15 MG tablet   methocarbamol (ROBAXIN) 500 MG tablet   Other Relevant Orders   POCT URINALYSIS DIP (CLINITEK) (Completed)   Urine Culture   LLQ pain       Relevant Orders   CBC with Differential/Platelet   Polyarthralgia       Relevant Orders   Sedimentation rate   C-reactive protein   Cyclic citrul peptide antibody, IgG   Family history of rheumatoid arthritis       Left lower quadrant abdominal pain       Relevant Orders   CT Abdomen Pelvis W Contrast   COMPLETE  METABOLIC PANEL WITH GFR      Urinalysis    Component Value Date/Time   COLORURINE yellow 03/21/2009 1015   APPEARANCEUR Hazy 03/21/2009 1015   LABSPEC 1.025 03/21/2009 1015   PHURINE 6.0 03/21/2009 1015   HGBUR trace-intact 03/21/2009 1015   BILIRUBINUR negative 07/19/2022 1102   BILIRUBINUR negative 11/17/2018 1445   KETONESUR negative 07/19/2022 1102   PROTEINUR Negative 11/17/2018 1445   UROBILINOGEN 1.0 07/19/2022 1102   UROBILINOGEN 0.2 03/21/2009 1015   NITRITE Negative 07/19/2022 1102   NITRITE negative 11/17/2018 1445   NITRITE negative 03/21/2009 1015   LEUKOCYTESUR Small (1+) (A) 07/19/2022 1102   Left lower quadrant pain and left-sided low back pain-possible diverticulitis especially with subjective fevers intermittent pain and change in stool color.  I would like to get further workup with a CT scan.  Colonoscopy is up-to-date.  Will also get labs to see if her white blood cell count is elevated.   No orders of the defined types were placed in this encounter.   Return in about 6 weeks (around 08/30/2022) for Overactive bladder. Beatrice Lecher, MD

## 2022-07-19 NOTE — Assessment & Plan Note (Signed)
Sounds like this is a separate and more longstanding issue.  We discussed doing Kegel exercises as well as frequent bathroom breaks and emptying the bladder regularly before leaving the house and when getting to a location where she may stay for an extended amount of time.  We also discussed that there are medications on the market that can help with overactive bladder by reducing frequency and urgency.  She is open to trying one of the medications.  But I would like to workup the abdominal pain and low back pain first before we start a new medication.

## 2022-07-20 LAB — CBC WITH DIFFERENTIAL/PLATELET
Absolute Monocytes: 460 cells/uL (ref 200–950)
Basophils Absolute: 37 cells/uL (ref 0–200)
Basophils Relative: 0.5 %
Eosinophils Absolute: 161 cells/uL (ref 15–500)
Eosinophils Relative: 2.2 %
HCT: 40 % (ref 35.0–45.0)
Hemoglobin: 13 g/dL (ref 11.7–15.5)
Lymphs Abs: 1927 cells/uL (ref 850–3900)
MCH: 27.2 pg (ref 27.0–33.0)
MCHC: 32.5 g/dL (ref 32.0–36.0)
MCV: 83.7 fL (ref 80.0–100.0)
MPV: 10.4 fL (ref 7.5–12.5)
Monocytes Relative: 6.3 %
Neutro Abs: 4716 cells/uL (ref 1500–7800)
Neutrophils Relative %: 64.6 %
Platelets: 296 10*3/uL (ref 140–400)
RBC: 4.78 10*6/uL (ref 3.80–5.10)
RDW: 13.9 % (ref 11.0–15.0)
Total Lymphocyte: 26.4 %
WBC: 7.3 10*3/uL (ref 3.8–10.8)

## 2022-07-20 LAB — COMPLETE METABOLIC PANEL WITH GFR
AG Ratio: 1.4 (calc) (ref 1.0–2.5)
ALT: 17 U/L (ref 6–29)
AST: 17 U/L (ref 10–35)
Albumin: 4.3 g/dL (ref 3.6–5.1)
Alkaline phosphatase (APISO): 59 U/L (ref 37–153)
BUN: 15 mg/dL (ref 7–25)
CO2: 29 mmol/L (ref 20–32)
Calcium: 9.5 mg/dL (ref 8.6–10.4)
Chloride: 102 mmol/L (ref 98–110)
Creat: 0.78 mg/dL (ref 0.50–1.05)
Globulin: 3.1 g/dL (calc) (ref 1.9–3.7)
Glucose, Bld: 78 mg/dL (ref 65–99)
Potassium: 3.8 mmol/L (ref 3.5–5.3)
Sodium: 142 mmol/L (ref 135–146)
Total Bilirubin: 0.6 mg/dL (ref 0.2–1.2)
Total Protein: 7.4 g/dL (ref 6.1–8.1)
eGFR: 84 mL/min/{1.73_m2} (ref >=60)

## 2022-07-20 LAB — URINE CULTURE
MICRO NUMBER:: 14505727
SPECIMEN QUALITY:: ADEQUATE

## 2022-07-20 LAB — CYCLIC CITRUL PEPTIDE ANTIBODY, IGG: Cyclic Citrullin Peptide Ab: <16 UNITS

## 2022-07-20 LAB — SEDIMENTATION RATE: Sed Rate: 14 mm/h (ref 0–30)

## 2022-07-20 LAB — C-REACTIVE PROTEIN: CRP: 15.3 mg/L — ABNORMAL HIGH (ref <8.0)

## 2022-07-23 NOTE — Progress Notes (Signed)
Call patient: Blood count looks great no sign of systemic infection which is good.  The test for rheumatoid was negative so no sign of rheumatoid arthritis.  Metabolic panel looks great.  Your urine culture was negative.  No sign of UTI.

## 2022-07-31 DIAGNOSIS — R1032 Left lower quadrant pain: Secondary | ICD-10-CM | POA: Diagnosis not present

## 2022-07-31 DIAGNOSIS — R350 Frequency of micturition: Secondary | ICD-10-CM | POA: Diagnosis not present

## 2022-08-02 ENCOUNTER — Telehealth: Payer: Self-pay | Admitting: Family Medicine

## 2022-08-02 NOTE — Telephone Encounter (Signed)
Please call patient and let her know that I did receive her CT results from [facility].  The lower part of your chest looks good with no worrisome features.  The liver and gallbladder also look good spleen and pancreas with no abnormalities.  Have a tiny kidney stone in the right kidney but it does not look like it is causing a problem or issues right now.  In the GI tract there is no obstruction but they did note that there is a fair amount of stool in the bowel, which is typically seen with constipation.  So I think we should try to address that since that is the only concerning thing that they saw on the scan.  I would recommend a trial of MiraLAX.  1 capful of the powder mixed with 6 to 8 ounces of water twice a day until bowel movements are soft and runny.  We can see if your pain improves especially in the abdomen.  But if the back pain continues then we may need to get you with that in with our sports medicine provider.

## 2022-08-02 NOTE — Telephone Encounter (Signed)
Attempted call to patient. Left voice mail message requesting a return call.

## 2022-08-03 NOTE — Telephone Encounter (Signed)
Left message requesting a return call.

## 2022-08-06 ENCOUNTER — Other Ambulatory Visit: Payer: Self-pay | Admitting: Neurology

## 2022-08-06 DIAGNOSIS — I10 Essential (primary) hypertension: Secondary | ICD-10-CM

## 2022-08-06 MED ORDER — AMLODIPINE BESYLATE 5 MG PO TABS: 5.0000 mg | ORAL_TABLET | Freq: Every day | ORAL | 0 refills | Status: DC

## 2022-08-06 MED ORDER — HYDROCHLOROTHIAZIDE 25 MG PO TABS: 25.0000 mg | ORAL_TABLET | Freq: Every day | ORAL | 0 refills | Status: DC

## 2022-08-06 NOTE — Telephone Encounter (Signed)
Patient just re-established care with Dr. Madilyn Fireman and called LVM asking to have refills of medications sent to pharmacy for her blood pressure. Sent one refill. Will need follow up appt.

## 2022-08-06 NOTE — Telephone Encounter (Signed)
Letter placed in mail to patient.

## 2022-08-07 ENCOUNTER — Telehealth: Payer: Self-pay | Admitting: Family Medicine

## 2022-08-07 NOTE — Telephone Encounter (Signed)
Called pt and lvm with results.

## 2022-08-07 NOTE — Telephone Encounter (Signed)
Called pt and LVM with the following information:  The lower part of your chest looks good with no worrisome features. The liver and gallbladder also look good spleen and pancreas with no abnormalities. Have a tiny kidney stone in the right kidney but it does not look like it is causing a problem or issues right now. In the GI tract there is no obstruction but they did note that there is a fair amount of stool in the bowel, which is typically seen with constipation. So I think we should try to address that since that is the only concerning thing that they saw on the scan. I would recommend a trial of MiraLAX. 1 capful of the powder mixed with 6 to 8 ounces of water twice a day until bowel movements are soft and runny. We can see if your pain improves especially in the abdomen. But if the back pain continues then we may need to get you with that in with our sports medicine provider.

## 2022-08-07 NOTE — Telephone Encounter (Signed)
Pt called asking to speak with Tonya to discuss her test results from her MRI. Please give her a call back at [phone]. Pt said it was okay to leave a voicemail.

## 2022-08-24 ENCOUNTER — Other Ambulatory Visit: Payer: Self-pay | Admitting: *Deleted

## 2022-08-24 DIAGNOSIS — F330 Major depressive disorder, recurrent, mild: Secondary | ICD-10-CM

## 2022-08-24 MED ORDER — TRAZODONE HCL 50 MG PO TABS: ORAL_TABLET | ORAL | 1 refills | Status: DC

## 2022-08-30 ENCOUNTER — Encounter: Payer: Self-pay | Admitting: Family Medicine

## 2022-08-30 ENCOUNTER — Ambulatory Visit (INDEPENDENT_AMBULATORY_CARE_PROVIDER_SITE_OTHER): Payer: Medicare HMO | Admitting: Family Medicine

## 2022-08-30 VITALS — BP 128/72 | HR 63 | Ht 61.0 in | Wt 220.0 lb

## 2022-08-30 DIAGNOSIS — M79672 Pain in left foot: Secondary | ICD-10-CM | POA: Diagnosis not present

## 2022-08-30 DIAGNOSIS — M79671 Pain in right foot: Secondary | ICD-10-CM | POA: Diagnosis not present

## 2022-08-30 DIAGNOSIS — K5900 Constipation, unspecified: Secondary | ICD-10-CM | POA: Insufficient documentation

## 2022-08-30 DIAGNOSIS — Z23 Encounter for immunization: Secondary | ICD-10-CM

## 2022-08-30 DIAGNOSIS — N3281 Overactive bladder: Secondary | ICD-10-CM

## 2022-08-30 DIAGNOSIS — K5904 Chronic idiopathic constipation: Secondary | ICD-10-CM

## 2022-08-30 DIAGNOSIS — I10 Essential (primary) hypertension: Secondary | ICD-10-CM

## 2022-08-30 LAB — POCT URINALYSIS DIP (CLINITEK)
Bilirubin, UA: NEGATIVE
Blood, UA: NEGATIVE
Glucose, UA: NEGATIVE mg/dL
Ketones, POC UA: NEGATIVE mg/dL
Nitrite, UA: NEGATIVE
POC PROTEIN,UA: NEGATIVE
Spec Grav, UA: 1.02 (ref 1.010–1.025)
Urobilinogen, UA: 0.2 E.U./dL
pH, UA: 6 (ref 5.0–8.0)

## 2022-08-30 MED ORDER — LOSARTAN POTASSIUM 50 MG PO TABS: 50.0000 mg | ORAL_TABLET | Freq: Every day | ORAL | 0 refills | Status: DC

## 2022-08-30 MED ORDER — SOLIFENACIN SUCCINATE 5 MG PO TABS: 5.0000 mg | ORAL_TABLET | Freq: Every day | ORAL | 0 refills | Status: DC

## 2022-08-30 NOTE — Progress Notes (Addendum)
Acute Office Visit  Subjective:     Patient ID: Carolyn [name], female    DOB: 09/03/1955, 67 y.o.   MRN: [id number]  Chief Complaint  Patient presents with   overactive bladder    HPI Patient is in today for OAB for a couple of months. Feels like it is worse in the AM.  She feels like she does more at night   no burning or dysuria.  No blood in the urine.  No prior bladder problems or surgeries.  No prior history of hysterectomy.  She feels like her symptoms started maybe 3 to 4 months ago.   She also has some medial foot pain and would like to have a referral to podiatry.  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  She still struggle some with constipation.  She says when she uses Metamucil regularly it seems to help but she just has a hard time taking it daily.  ROS      Objective:    BP 128/72   Pulse 63   Ht '5\' 1"'$  (1.549 m)   Wt 220 lb (99.8 kg)   LMP 07/07/2012   SpO2 93%   BMI 41.57 kg/m    Physical Exam Vitals reviewed.  Constitutional:      Appearance: She is well-developed.  HENT:     Head: Normocephalic and atraumatic.  Eyes:     Conjunctiva/sclera: Conjunctivae normal.  Cardiovascular:     Rate and Rhythm: Normal rate.  Pulmonary:     Effort: Pulmonary effort is normal.  Skin:    General: Skin is dry.     Coloration: Skin is not pale.  Neurological:     Mental Status: She is alert and oriented to person, place, and time.  Psychiatric:        Behavior: Behavior normal.     Results for orders placed or performed in visit on 08/30/22  POCT URINALYSIS DIP (CLINITEK)  Result Value Ref Range   Color, UA yellow yellow   Clarity, UA cloudy (A) clear   Glucose, UA negative negative mg/dL   Bilirubin, UA negative negative   Ketones, POC UA negative negative mg/dL   Spec Grav, UA 1.020 1.010 - 1.025   Blood, UA negative negative   pH, UA 6.0 5.0 - 8.0   POC PROTEIN,UA negative  negative, trace   Urobilinogen, UA 0.2 0.2 or 1.0 E.U./dL   Nitrite, UA Negative Negative   Leukocytes, UA Small (1+) (A) Negative        Assessment & Plan:   Problem List Items Addressed This Visit       Cardiovascular and Mediastinum   HYPERTENSION, BENIGN SYSTEMIC    Pressure looks absolutely fantastic but organ to discontinue the hydrochlorothiazide because of overactive bladder symptoms and switch to losartan instead.  Follow-up in 6 weeks to recheck pressure to make sure adequately controlled.  Will need a BMP at that time as well.      Relevant Medications   losartan (COZAAR) 50 MG tablet     Genitourinary   OAB (overactive bladder) - Primary    Her symptoms are most consistent with overactive bladder syndrome.  We discussed potential diagnosis.  Urinalysis was clear today no sign of glucose protein or infection.  Also discussed Kegel exercises to strengthen the pelvic muscles to help reduce leakage.  We also discussed discontinuing or at least reducing her hydrochlorothiazide  which could contribute to her symptoms especially during the daytime.  She was open to switching the medication.  Will switch to losartan.  If she starts getting some swelling we can maybe restart the hydrochlorothiazide at 12.5 mg and maybe even combined with the losartan if needed.  And also discussed the option of overactive bladder medications.  She would like to try a medication.  Will start with generic Vesicare.  Follow-up in 6 to 8 weeks.  If not improving then we can discuss alternative options or consider referral to urology.  Will start with 5 mg dose of Vesicare and then adjust up to 10 if needed.      Relevant Medications   solifenacin (VESICARE) 5 MG tablet   Other Relevant Orders   POCT URINALYSIS DIP (CLINITEK) (Completed)     Other   Constipation    We did discuss maybe trying the alternative of Benefiber.  It might be easy to throw in her drink in the morning and then sip on throughout  the day.  She can also consider adding a stool softener such as Colace with her daily regimen of medications.  And okay to use a laxative from time to time if needed.      Other Visit Diagnoses     Pain in both feet       Relevant Orders   Ambulatory referral to Podiatry       Meds ordered this encounter  Medications   solifenacin (VESICARE) 5 MG tablet    Sig: Take 1 tablet (5 mg total) by mouth daily.    Dispense:  90 tablet    Refill:  0   losartan (COZAAR) 50 MG tablet    Sig: Take 1 tablet (50 mg total) by mouth daily.    Dispense:  90 tablet    Refill:  0    Return in about 6 weeks (around 10/11/2022) for New start medication adn BMP.  Beatrice Lecher, MD

## 2022-08-30 NOTE — Assessment & Plan Note (Signed)
We did discuss maybe trying the alternative of Benefiber.  It might be easy to throw in her drink in the morning and then sip on throughout the day.  She can also consider adding a stool softener such as Colace with her daily regimen of medications.  And okay to use a laxative from time to time if needed.

## 2022-08-30 NOTE — Progress Notes (Signed)
Pt reports that she has been experiencing some urgency especially in the mornings for the past 1-2 months.

## 2022-08-30 NOTE — Addendum Note (Signed)
Addended by: Beatrice Lecher D on: 08/30/2022 05:08 PM   Modules accepted: Orders

## 2022-08-30 NOTE — Assessment & Plan Note (Signed)
Pressure looks absolutely fantastic but organ to discontinue the hydrochlorothiazide because of overactive bladder symptoms and switch to losartan instead.  Follow-up in 6 weeks to recheck pressure to make sure adequately controlled.  Will need a BMP at that time as well.

## 2022-08-30 NOTE — Patient Instructions (Signed)
Can try Benefiber instead of the Metamucil.

## 2022-08-30 NOTE — Assessment & Plan Note (Addendum)
Her symptoms are most consistent with overactive bladder syndrome.  We discussed potential diagnosis.  Urinalysis was clear today no sign of glucose protein or infection.  Also discussed Kegel exercises to strengthen the pelvic muscles to help reduce leakage.  We also discussed discontinuing or at least reducing her hydrochlorothiazide which could contribute to her symptoms especially during the daytime.  She was open to switching the medication.  Will switch to losartan.  If she starts getting some swelling we can maybe restart the hydrochlorothiazide at 12.5 mg and maybe even combined with the losartan if needed.  And also discussed the option of overactive bladder medications.  She would like to try a medication.  Will start with generic Vesicare.  Follow-up in 6 to 8 weeks.  If not improving then we can discuss alternative options or consider referral to urology.  Will start with 5 mg dose of Vesicare and then adjust up to 10 if needed.

## 2022-10-16 ENCOUNTER — Ambulatory Visit: Payer: Medicare HMO | Admitting: Family Medicine

## 2022-10-16 ENCOUNTER — Ambulatory Visit (INDEPENDENT_AMBULATORY_CARE_PROVIDER_SITE_OTHER): Payer: Medicare HMO | Admitting: Family Medicine

## 2022-10-16 ENCOUNTER — Encounter: Payer: Self-pay | Admitting: Family Medicine

## 2022-10-16 VITALS — BP 124/77 | HR 68 | Ht 60.0 in | Wt 219.0 lb

## 2022-10-16 DIAGNOSIS — N3281 Overactive bladder: Secondary | ICD-10-CM

## 2022-10-16 DIAGNOSIS — T50905A Adverse effect of unspecified drugs, medicaments and biological substances, initial encounter: Secondary | ICD-10-CM

## 2022-10-16 DIAGNOSIS — F330 Major depressive disorder, recurrent, mild: Secondary | ICD-10-CM | POA: Diagnosis not present

## 2022-10-16 DIAGNOSIS — M542 Cervicalgia: Secondary | ICD-10-CM

## 2022-10-16 DIAGNOSIS — K219 Gastro-esophageal reflux disease without esophagitis: Secondary | ICD-10-CM | POA: Diagnosis not present

## 2022-10-16 DIAGNOSIS — G8929 Other chronic pain: Secondary | ICD-10-CM

## 2022-10-16 DIAGNOSIS — I10 Essential (primary) hypertension: Secondary | ICD-10-CM

## 2022-10-16 MED ORDER — DULOXETINE HCL 30 MG PO CPEP: 30.0000 mg | ORAL_CAPSULE | Freq: Every day | ORAL | 3 refills | Status: DC

## 2022-10-16 MED ORDER — OXYBUTYNIN CHLORIDE ER 5 MG PO TB24
5.0000 mg | ORAL_TABLET | Freq: Every day | ORAL | 1 refills | Status: DC
Start: 2022-10-16 — End: 2023-10-08

## 2022-10-16 NOTE — Assessment & Plan Note (Signed)
And avoiding trigger foods.

## 2022-10-16 NOTE — Assessment & Plan Note (Signed)
Pressure looks great today on new regimen.  Will get updated BMP.  Follow-up in 4 months.

## 2022-10-16 NOTE — Assessment & Plan Note (Signed)
Will switch to Ditropan which looks like it is covered on her formulary to see if symptoms are still well-controlled but not having headaches like she is currently experiencing with the Vesicare.  It looks like Enablex and Myrbetriq are not covered.

## 2022-10-16 NOTE — Progress Notes (Addendum)
   Established Patient Office Visit  Subjective   Patient ID: Carolyn [name], female    DOB: 1956-03-11  Age: 67 y.o. MRN: [id number]  Chief Complaint  Patient presents with   Over Active Bladder    HPI  Here for follow-up of new start Vesicare for overactive bladder symptoms.  She has had a few more headaches since starting the medication and has noticed some occasional chest discomfort.  Though she wonders if that could just be her acid reflux.  She has had a little dry mouth as well but she has noticed a big improvement in her symptoms.  Hypertension-we had also discontinued her HCTZ and increase the losartan to compensate to also help with the overactive bladder symptoms.  She seems to be doing well on the new regimen and blood pressures look fantastic.    ROS    Objective:     BP 124/77   Pulse 68   Ht 5' (1.524 m)   Wt 219 lb (99.3 kg)   LMP 07/07/2012   SpO2 94%   BMI 42.77 kg/m    Physical Exam Vitals and nursing note reviewed.  Constitutional:      Appearance: She is well-developed.  HENT:     Head: Normocephalic and atraumatic.  Cardiovascular:     Rate and Rhythm: Normal rate and regular rhythm.     Heart sounds: Normal heart sounds.  Pulmonary:     Effort: Pulmonary effort is normal.     Breath sounds: Normal breath sounds.  Skin:    General: Skin is warm and dry.  Neurological:     Mental Status: She is alert and oriented to person, place, and time.  Psychiatric:        Behavior: Behavior normal.      No results found for any visits on 10/16/22.    The 10-year ASCVD risk score (Arnett DK, et al., 2019) is: 10.3%    Assessment & Plan:   Problem List Items Addressed This Visit       Cardiovascular and Mediastinum   HYPERTENSION, BENIGN SYSTEMIC    Pressure looks great today on new regimen.  Will get updated BMP.  Follow-up in 4 months.      Relevant Orders   Basic Metabolic Panel (BMET)     Digestive   GASTROESOPHAGEAL REFLUX, NO  ESOPHAGITIS    And avoiding trigger foods.        Genitourinary   OAB (overactive bladder) - Primary    Will switch to Ditropan which looks like it is covered on her formulary to see if symptoms are still well-controlled but not having headaches like she is currently experiencing with the Vesicare.  It looks like Enablex and Myrbetriq are not covered.      Relevant Medications   oxybutynin (DITROPAN XL) 5 MG 24 hr tablet     Other   Major depressive disorder, recurrent episode (HCC)   Relevant Medications   DULoxetine (CYMBALTA) 30 MG capsule   Chronic cervical pain   Relevant Medications   DULoxetine (CYMBALTA) 30 MG capsule   Other Visit Diagnoses     Medication side effect, initial encounter           Return in about 4 months (around 02/15/2023) for BP and Mood.    Nani Gasser, MD

## 2022-10-16 NOTE — Progress Notes (Signed)
Pt stated that the Vesicare causes headaches however, she continues to take the medication.

## 2022-10-17 LAB — BASIC METABOLIC PANEL
BUN: 16 mg/dL (ref 7–25)
CO2: 28 mmol/L (ref 20–32)
Calcium: 9.5 mg/dL (ref 8.6–10.4)
Chloride: 105 mmol/L (ref 98–110)
Creat: 0.93 mg/dL (ref 0.50–1.05)
Glucose, Bld: 84 mg/dL (ref 65–99)
Potassium: 4.2 mmol/L (ref 3.5–5.3)
Sodium: 139 mmol/L (ref 135–146)

## 2022-10-17 NOTE — Progress Notes (Signed)
Your lab work is within acceptable range and there are no concerning findings.   ?

## 2022-10-23 ENCOUNTER — Telehealth: Payer: Self-pay | Admitting: General Practice

## 2022-10-23 NOTE — Telephone Encounter (Signed)
Called patient to schedule Medicare Annual Wellness Visit (AWV). Left message for patient to call back and schedule Medicare Annual Wellness Visit (AWV).  Last date of AWV: not listed  Please schedule an appointment at any time with nurse health advisor.  If any questions, please contact me at [phone].  Thank you ,  Modesto Charon, RN BSN

## 2022-11-02 ENCOUNTER — Other Ambulatory Visit: Payer: Self-pay | Admitting: Family Medicine

## 2022-11-02 DIAGNOSIS — I10 Essential (primary) hypertension: Secondary | ICD-10-CM

## 2022-11-13 ENCOUNTER — Other Ambulatory Visit: Payer: Self-pay | Admitting: Family Medicine

## 2022-11-13 DIAGNOSIS — I10 Essential (primary) hypertension: Secondary | ICD-10-CM

## 2022-11-26 ENCOUNTER — Other Ambulatory Visit: Payer: Self-pay | Admitting: Family Medicine

## 2022-11-26 DIAGNOSIS — N3281 Overactive bladder: Secondary | ICD-10-CM

## 2022-12-18 ENCOUNTER — Telehealth: Payer: Self-pay | Admitting: Family Medicine

## 2022-12-18 NOTE — Telephone Encounter (Signed)
Patient called requesting a refill of atorvastatin (LIPITOR) 20 MG tablet [[id number]] and Famotidine 20 mg   Pharmacy ;   [id number], [place] - [address]

## 2022-12-19 ENCOUNTER — Other Ambulatory Visit: Payer: Self-pay | Admitting: *Deleted

## 2022-12-19 MED ORDER — ATORVASTATIN CALCIUM 20 MG PO TABS: 20.0000 mg | ORAL_TABLET | Freq: Every day | ORAL | 0 refills | Status: DC

## 2022-12-19 MED ORDER — FAMOTIDINE 20 MG PO TABS: 20.0000 mg | ORAL_TABLET | Freq: Every evening | ORAL | 1 refills | Status: DC | PRN

## 2022-12-19 NOTE — Telephone Encounter (Signed)
Medications sent to pharmacy listed.

## 2022-12-19 NOTE — Telephone Encounter (Signed)
Informed patient that the medications have been sent to the pharmacy.

## 2023-01-17 ENCOUNTER — Ambulatory Visit: Payer: Medicare HMO | Admitting: Podiatry

## 2023-01-17 ENCOUNTER — Encounter: Payer: Self-pay | Admitting: Podiatry

## 2023-01-17 ENCOUNTER — Other Ambulatory Visit: Payer: Self-pay | Admitting: Podiatry

## 2023-01-17 ENCOUNTER — Ambulatory Visit (INDEPENDENT_AMBULATORY_CARE_PROVIDER_SITE_OTHER): Payer: Medicare HMO

## 2023-01-17 DIAGNOSIS — M79671 Pain in right foot: Secondary | ICD-10-CM

## 2023-01-17 DIAGNOSIS — M76821 Posterior tibial tendinitis, right leg: Secondary | ICD-10-CM

## 2023-01-17 DIAGNOSIS — M19071 Primary osteoarthritis, right ankle and foot: Secondary | ICD-10-CM | POA: Diagnosis not present

## 2023-01-17 MED ORDER — MELOXICAM 15 MG PO TABS
15.0000 mg | ORAL_TABLET | Freq: Every day | ORAL | 0 refills | Status: DC
Start: 2023-01-17 — End: 2023-02-18

## 2023-01-17 NOTE — Progress Notes (Signed)
  Subjective:  Patient ID: Carolyn [name], female    DOB: Nov 01, 1955,   MRN: [id number]  Chief Complaint  Patient presents with   Foot Pain    Right foot pain and toenail discoloration that started about a year ago.    67 y.o. female presents for concern of right foot pain that has been going on for about year. Relates the pain is mostly on the inside arch of her right foot. 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 Relates using aleve with some relief. She has tried a compression ankle brace with minimal relief as well. Also relates concern for darkening of the toenails . Denies any other pedal complaints. Denies n/v/f/c.   Past Medical History:  Diagnosis Date   Arthritis    neck   Asthma    mild persistent   Constipation    Depression    Eczema    Female bladder prolapse    Hypertension    Insomnia    SUI (stress urinary incontinence, female)     Objective:  Physical Exam: Vascular: DP/PT pulses 2/4 bilateral. CFT <3 seconds. Normal hair growth on digits. No edema.  Skin. No lacerations or abrasions bilateral feet. Nails 1-5 bilateral with darkened streaks noted in the nails likely from hyperpigmentation in the matrix.  Musculoskeletal: MMT 5/5 bilateral lower extremities in DF, PF, Inversion and Eversion. Deceased ROM in DF of ankle joint.  Tender around insertion of peroneal tendon noted. Tenderness with inversion and eversion of the foot. Some pain noted to lateral sinus tarsia area likely from impingement. Pes planus noted bilateral.  Neurological: Sensation intact to light touch.   Assessment:   1. Posterior tibial tendon dysfunction (PTTD) of right lower extremity      Plan:  Patient was evaluated and treated and all questions answered. X-rays reviewed and discussed with patient. No acute fracture or dislocations noted.  Pes planus noted.  Discussed PTTD and pes planus diagnosis and treatment options with  patient. Stretching exercises discussed and handout dispensed. Prescription for meloxicam provided. Most recent BMP kidney function was appropriate.  Tri-Lock ankle brace dispensed. Discussed if there is no improvement PT/MRI/injection may be an option. Patient to return to clinic in 6 to 8 weeks or sooner if symptoms fail to improve or worsen.   Louann Sjogren, DPM

## 2023-01-17 NOTE — Patient Instructions (Signed)
Posterior Tibial Tendinitis Rehab Ask your health care provider which exercises are safe for you. Do exercises exactly as told by your provider and adjust them as directed. It's normal to feel mild stretching, pulling, tightness, or discomfort as you do these exercises. Stop right away if you feel sudden pain or your pain gets worse. Do not begin these exercises until told by your provider. Stretching and range-of-motion exercises These exercises warm up your muscles and joints and improve the movement and flexibility in your ankle and foot. These exercises may also help to relieve pain. Standing wall calf stretch, knee straight  Stand with your hands against a wall. Extend your left / right leg behind you, and bend your front knee slightly. If told, place a folded washcloth under the arch of your foot for support. Point the toes of your back foot slightly inward. Keeping your heels on the floor and your back knee straight, shift your weight toward the wall. Do not allow your back to arch. You should feel a gentle stretch in your upper left / right calf. Hold this position for __________ seconds. Repeat __________ times. Complete this exercise __________ times a day. Standing wall calf stretch, knee bent  Stand with your hands against a wall. Extend your left / right leg behind you, and bend your front knee slightly. If told, place a folded washcloth under the arch of your foot for support. Point the toes of your back foot slightly inward. Unlock your back knee so it's bent. Keep your heels on the floor. You should feel a gentle stretch deep in your lower left / right calf. Hold this position for __________ seconds. Repeat __________ times. Complete this exercise __________ times a day. Strengthening exercises These exercises build strength and endurance in your ankle and foot. Endurance is the ability to use your muscles for a long time, even after they get tired. Ankle inversion with  band  Secure one end of a rubber exercise band or tubing to a fixed object, such as a table leg or a pole, that will stay still when the band is pulled. Loop the other end of the band around the middle of your left / right foot. Sit on the floor facing the object with your left / right leg extended. The band or tube should be slightly tense when your foot is relaxed. Leading with your big toe, slowly bring your left / right foot and ankle inward, toward your other foot (inversion). Hold this position for __________ seconds. Slowly return your foot to the starting position. Repeat __________ times. Complete this exercise __________ times a day. Towel curls  Sit in a chair on a non-carpeted surface, and put your feet on the floor. Place a towel in front of your feet. If told by your provider, add a __________ weight to the end of the towel. Keeping your heel on the floor, put your left / right foot on the towel. Pull the towel toward you by grabbing the towel with your toes and curling them under. Keep your heel on the floor while you do this. Let your toes relax. Grab the towel with your toes again. Keep going until the towel is completely underneath your foot. Repeat __________ times. Complete this exercise __________ times a day. Balance exercise This exercise improves or maintains your balance. Balance is important in preventing falls. Single leg stand  Without wearing shoes, stand near a railing or in a doorway. You may hold on to the railing or   doorframe as needed for balance. Stand on your left / right foot. Keep your big toe down on the floor and try to keep your arch lifted. If balancing in this position is too easy, try the exercise with your eyes closed or while standing on a pillow. Hold this position for __________ seconds. Repeat __________ times. Complete this exercise __________ times a day. This information is not intended to replace advice given to you by your health care  provider. Make sure you discuss any questions you have with your health care provider. Document Revised: 06/06/2022 Document Reviewed: 06/06/2022 Elsevier Patient Education  2024 [employer]vier Inc.  

## 2023-01-25 DIAGNOSIS — Z961 Presence of intraocular lens: Secondary | ICD-10-CM | POA: Diagnosis not present

## 2023-02-07 ENCOUNTER — Other Ambulatory Visit: Payer: Self-pay | Admitting: Family Medicine

## 2023-02-07 DIAGNOSIS — F330 Major depressive disorder, recurrent, mild: Secondary | ICD-10-CM

## 2023-02-08 ENCOUNTER — Other Ambulatory Visit: Payer: Self-pay | Admitting: Family Medicine

## 2023-02-08 DIAGNOSIS — I10 Essential (primary) hypertension: Secondary | ICD-10-CM

## 2023-02-15 ENCOUNTER — Encounter: Payer: Self-pay | Admitting: Family Medicine

## 2023-02-15 ENCOUNTER — Other Ambulatory Visit (HOSPITAL_COMMUNITY)
Admission: RE | Admit: 2023-02-15 | Discharge: 2023-02-15 | Disposition: A | Discharge status: Home / Self Care | Payer: Medicare HMO | Source: Ambulatory Visit | Attending: Family Medicine | Admitting: Family Medicine

## 2023-02-15 ENCOUNTER — Ambulatory Visit (INDEPENDENT_AMBULATORY_CARE_PROVIDER_SITE_OTHER): Payer: Medicare HMO | Admitting: Family Medicine

## 2023-02-15 VITALS — BP 132/84 | HR 53 | Ht 60.0 in | Wt 221.0 lb

## 2023-02-15 DIAGNOSIS — Z1151 Encounter for screening for human papillomavirus (HPV): Secondary | ICD-10-CM | POA: Insufficient documentation

## 2023-02-15 DIAGNOSIS — Z23 Encounter for immunization: Secondary | ICD-10-CM | POA: Diagnosis not present

## 2023-02-15 DIAGNOSIS — Z124 Encounter for screening for malignant neoplasm of cervix: Secondary | ICD-10-CM

## 2023-02-15 DIAGNOSIS — Z01419 Encounter for gynecological examination (general) (routine) without abnormal findings: Secondary | ICD-10-CM | POA: Insufficient documentation

## 2023-02-15 DIAGNOSIS — Z780 Asymptomatic menopausal state: Secondary | ICD-10-CM

## 2023-02-15 DIAGNOSIS — I10 Essential (primary) hypertension: Secondary | ICD-10-CM | POA: Diagnosis not present

## 2023-02-15 NOTE — Assessment & Plan Note (Signed)
Well controlled. Continue current regimen. Follow up in  6 mo  

## 2023-02-15 NOTE — Progress Notes (Signed)
   Established Patient Office Visit  Subjective   Patient ID: Carolyn [name], female    DOB: Jan 13, 1956  Age: 67 y.o. MRN: [id number]  Chief Complaint  Patient presents with   Gynecologic Exam   Hypertension    HPI  Hypertension- Pt denies chest pain, SOB, dizziness, or heart palpitations.  Taking meds as directed w/o problems.  Denies medication side effects.  Did have some salt food and has been taking a decongestant.    Due for pap smear.  She would like to have that done today 2.     ROS    Objective:     BP 132/84   Pulse (!) 53   Ht 5' (1.524 m)   Wt 221 lb (100.2 kg)   LMP 07/07/2012   SpO2 94%   BMI 43.16 kg/m    Physical Exam Vitals and nursing note reviewed. Exam conducted with a chaperone present.  Constitutional:      Appearance: Normal appearance.  HENT:     Head: Normocephalic and atraumatic.  Eyes:     Conjunctiva/sclera: Conjunctivae normal.  Cardiovascular:     Rate and Rhythm: Normal rate and regular rhythm.  Pulmonary:     Effort: Pulmonary effort is normal.     Breath sounds: Normal breath sounds.  Genitourinary:    General: Normal vulva.     Labia:        Right: No rash.        Left: No rash.      Vagina: Normal.     Cervix: Normal.     Adnexa: Right adnexa normal and left adnexa normal.     Comments: Uterus with mild prolapse  Skin:    General: Skin is warm and dry.  Neurological:     Mental Status: She is alert.  Psychiatric:        Mood and Affect: Mood normal.      No results found for any visits on 02/15/23.    The 10-year ASCVD risk score (Arnett DK, et al., 2019) is: 11.9%    Assessment & Plan:   Problem List Items Addressed This Visit       Cardiovascular and Mediastinum   HYPERTENSION, BENIGN SYSTEMIC    Well controlled. Continue current regimen. Follow up in  6 mo       Other Visit Diagnoses     Screening for cervical cancer    -  Primary   Relevant Orders   Cytology - PAP   Encounter for  immunization       Relevant Orders   Flu Vaccine Trivalent High Dose (Fluad) (Completed)   Post-menopausal       Relevant Orders   DG Bone Density      We will call with Pap smear results as soon as available.  She does have some prolapse but right now is not causing any problems or discomfort.  Certainly if over time we need to address further we can.  That she 14 she is due for a bone density test.  I spent 30 minutes on the day of the encounter to include pre-visit record review, face-to-face time with the patient and post visit ordering of test.   Return in about 6 months (around 08/16/2023) for Hypertension.    Carolyn Gasser, MD

## 2023-02-18 ENCOUNTER — Other Ambulatory Visit: Payer: Self-pay | Admitting: Podiatry

## 2023-02-20 ENCOUNTER — Other Ambulatory Visit: Payer: Self-pay | Admitting: Family Medicine

## 2023-02-21 ENCOUNTER — Other Ambulatory Visit: Payer: Self-pay | Admitting: Sports Medicine

## 2023-02-21 DIAGNOSIS — N3281 Overactive bladder: Secondary | ICD-10-CM

## 2023-02-21 LAB — CYTOLOGY - PAP
Comment: NEGATIVE
Diagnosis: NEGATIVE
High risk HPV: NEGATIVE

## 2023-02-21 NOTE — Progress Notes (Signed)
Caera, Pap smear looks great.  No need to repeat in the future.

## 2023-02-28 ENCOUNTER — Ambulatory Visit (INDEPENDENT_AMBULATORY_CARE_PROVIDER_SITE_OTHER): Payer: Medicare HMO | Admitting: Podiatry

## 2023-02-28 DIAGNOSIS — Z91199 Patient's noncompliance with other medical treatment and regimen due to unspecified reason: Secondary | ICD-10-CM

## 2023-02-28 NOTE — Progress Notes (Signed)
No show

## 2023-03-19 ENCOUNTER — Other Ambulatory Visit: Payer: Self-pay | Admitting: Podiatry

## 2023-04-15 ENCOUNTER — Other Ambulatory Visit: Payer: Self-pay | Admitting: Podiatry

## 2023-05-03 ENCOUNTER — Other Ambulatory Visit: Payer: Self-pay | Admitting: Family Medicine

## 2023-05-03 DIAGNOSIS — I10 Essential (primary) hypertension: Secondary | ICD-10-CM

## 2023-05-14 DIAGNOSIS — Z0100 Encounter for examination of eyes and vision without abnormal findings: Secondary | ICD-10-CM | POA: Diagnosis not present

## 2023-05-14 DIAGNOSIS — H524 Presbyopia: Secondary | ICD-10-CM | POA: Diagnosis not present

## 2023-05-21 ENCOUNTER — Other Ambulatory Visit: Payer: Self-pay | Admitting: Sports Medicine

## 2023-05-21 DIAGNOSIS — N3281 Overactive bladder: Secondary | ICD-10-CM

## 2023-05-30 ENCOUNTER — Telehealth: Payer: Self-pay

## 2023-05-30 MED ORDER — ALBUTEROL SULFATE HFA 108 (90 BASE) MCG/ACT IN AERS: 2.0000 | INHALATION_SPRAY | Freq: Four times a day (QID) | RESPIRATORY_TRACT | 1 refills | Status: DC | PRN

## 2023-05-30 NOTE — Telephone Encounter (Signed)
Copied from CRM [id number]. Topic: Clinical - Medication Refill >> May 29, 2023  2:42 PM Prudencio Pair wrote: Most Recent Primary Care Visit:  Provider: Nani Gasser D  Department: [facility]  Visit Type: OFFICE VISIT  Date: 02/15/2023  Medication: Asthma Inhaler (not listed in medications)  Has the patient contacted their pharmacy? No (Agent: If no, request that the patient contact the pharmacy for the refill. If patient does not wish to contact the pharmacy document the reason why and proceed with request.) (Agent: If yes, when and what did the pharmacy advise?)  Is this the correct pharmacy for this prescription? Yes If no, delete pharmacy and type the correct one.  This is the patient's preferred pharmacy:   [id number], [place] - [address] [place] [address] Phone: [phone] Fax: [phone]   Has the prescription been filled recently? No  Is the patient out of the medication? N/A  Has the patient been seen for an appointment in the last year OR does the patient have an upcoming appointment? Yes  Can we respond through MyChart? No  Agent: Please be advised that Rx refills may take up to 3 business days. We ask that you follow-up with your pharmacy.

## 2023-05-30 NOTE — Telephone Encounter (Signed)
Meds ordered this encounter  Medications   albuterol (PROAIR HFA) 108 (90 Base) MCG/ACT inhaler    Sig: Inhale 2 puffs into the lungs every 6 (six) hours as needed for wheezing.    Dispense:  18 g    Refill:  1

## 2023-05-30 NOTE — Telephone Encounter (Signed)
Attempted call to patient. Left a voice mail message requesting a return call.  

## 2023-05-31 NOTE — Telephone Encounter (Signed)
Attempted call to patient. Left a voice mail message requesting a return call.  

## 2023-06-11 ENCOUNTER — Ambulatory Visit
Admission: EM | Admit: 2023-06-11 | Discharge: 2023-06-11 | Disposition: A | Discharge status: Home / Self Care | Payer: Medicare HMO | Attending: Family Medicine | Admitting: Family Medicine

## 2023-06-11 DIAGNOSIS — J0100 Acute maxillary sinusitis, unspecified: Secondary | ICD-10-CM | POA: Diagnosis not present

## 2023-06-11 DIAGNOSIS — R059 Cough, unspecified: Secondary | ICD-10-CM | POA: Diagnosis not present

## 2023-06-11 DIAGNOSIS — J3489 Other specified disorders of nose and nasal sinuses: Secondary | ICD-10-CM | POA: Diagnosis not present

## 2023-06-11 MED ORDER — PREDNISONE 20 MG PO TABS: ORAL_TABLET | ORAL | 0 refills | Status: DC

## 2023-06-11 MED ORDER — HYDROCODONE BIT-HOMATROP MBR 5-1.5 MG/5ML PO SOLN: 5.0000 mL | Freq: Four times a day (QID) | ORAL | 0 refills | Status: DC | PRN

## 2023-06-11 MED ORDER — BENZONATATE 200 MG PO CAPS: 200.0000 mg | ORAL_CAPSULE | Freq: Three times a day (TID) | ORAL | 0 refills | Status: AC | PRN

## 2023-06-11 MED ORDER — AMOXICILLIN-POT CLAVULANATE 875-125 MG PO TABS: 1.0000 | ORAL_TABLET | Freq: Two times a day (BID) | ORAL | 0 refills | Status: DC

## 2023-06-11 NOTE — ED Triage Notes (Signed)
Pt presents to [facility] with [facility] of sinus congestion, cough since Sunday. Pt reports taking otc cold and flu medication cordicidin

## 2023-06-11 NOTE — ED Provider Notes (Signed)
[facility]    CSN: [id number] Arrival date & time: 06/11/23  6213      History   Chief Complaint Chief Complaint  Patient presents with   Facial Pain    HPI Carolyn [name] is a 67 y.o. female.   HPI 67 year old female presents with sinus congestion and cough for 2-3 days.  PMH significant for fatigue, obesity, and and chronic cervical pain.  Past Medical History:  Diagnosis Date   Arthritis    neck   Asthma    mild persistent   Constipation    Depression    Eczema    Female bladder prolapse    Hypertension    Insomnia    SUI (stress urinary incontinence, female)     Patient Active Problem List   Diagnosis Date Noted   Constipation 08/30/2022   OAB (overactive bladder) 07/19/2022   Bradycardia 03/14/2020   Insomnia 11/17/2018   Allergic shiners 04/25/2011   Renal colic on left side 01/12/2011   B12 DEFICIENCY 06/05/2010   Vitamin D deficiency 05/15/2010   FATIGUE 05/15/2010   HEMATURIA UNSPECIFIED 03/21/2009   MENORRHAGIA 03/21/2009   PAIN IN JOINT, MULTIPLE SITES 03/21/2009   Migraine with aura 08/09/2008   Allergic rhinitis 08/09/2008   Asthma, mild persistent 07/11/2007   Chronic cervical pain 10/22/2006   OBESITY, NOS 03/26/2006   Major depressive disorder, recurrent episode (HCC) 03/26/2006   HYPERTENSION, BENIGN SYSTEMIC 03/26/2006   GASTROESOPHAGEAL REFLUX, NO ESOPHAGITIS 03/26/2006    History reviewed. No pertinent surgical history.  OB History     Gravida  2   Para      Term      Preterm      AB      Living  2      SAB      IAB      Ectopic      Multiple      Live Births  2            Home Medications    Prior to Admission medications   Medication Sig Start Date End Date Taking? Authorizing Provider  amoxicillin-clavulanate (AUGMENTIN) 875-125 MG tablet Take 1 tablet by mouth every 12 (twelve) hours. 06/11/23  Yes Trevor Iha, FNP  benzonatate (TESSALON) 200 MG capsule Take 1 capsule (200 mg  total) by mouth 3 (three) times daily as needed for up to 7 days. 06/11/23 06/18/23 Yes Trevor Iha, FNP  HYDROcodone bit-homatropine (HYCODAN) 5-1.5 MG/5ML syrup Take 5 mLs by mouth every 6 (six) hours as needed for cough. 06/11/23  Yes Trevor Iha, FNP  predniSONE (DELTASONE) 20 MG tablet Take 3 tabs PO daily x 5 days. 06/11/23  Yes Trevor Iha, FNP  albuterol (PROAIR HFA) 108 (90 Base) MCG/ACT inhaler Inhale 2 puffs into the lungs every 6 (six) hours as needed for wheezing. 05/30/23   Agapito Games, MD  amLODipine (NORVASC) 5 MG tablet TAKE 1 TABLET (5 MG TOTAL) BY MOUTH DAILY. 11/02/22   Agapito Games, MD  atorvastatin (LIPITOR) 20 MG tablet TAKE 1 TABLET BY MOUTH EVERY DAY 02/21/23   Agapito Games, MD  DULoxetine (CYMBALTA) 30 MG capsule Take 1 capsule (30 mg total) by mouth daily. 10/16/22   Agapito Games, MD  famotidine (PEPCID) 20 MG tablet Take 1 tablet (20 mg total) by mouth at bedtime as needed for heartburn. 12/19/22   Agapito Games, MD  losartan (COZAAR) 50 MG tablet TAKE 1 TABLET BY MOUTH EVERY DAY 05/03/23  Agapito Games, MD  meloxicam (MOBIC) 15 MG tablet TAKE 1 TABLET (15 MG TOTAL) BY MOUTH DAILY. 04/16/23   Louann Sjogren, DPM  oxybutynin (DITROPAN XL) 5 MG 24 hr tablet Take 1 tablet (5 mg total) by mouth at bedtime. 10/16/22   Agapito Games, MD  SUMAtriptan (IMITREX) 100 MG tablet take 1 tablet by mouth AT ONSET OF MIGRAINE MAY REPEAT ONCE AFTER 2 HOURS 04/13/21   Agapito Games, MD  traZODone (DESYREL) 50 MG tablet TAKE 1 TABLET(50 MG) BY MOUTH AT BEDTIME. 02/07/23   Agapito Games, MD    Family History Family History  Problem Relation Age of Onset   Hypertension Father    Hypertension Sister    Hypertension Sister    Cancer Other        breast- late 31's    Social History Social History   Tobacco Use   Smoking status: Never   Smokeless tobacco: Never  Vaping Use   Vaping status: Never Used   Substance Use Topics   Alcohol use: No   Drug use: No     Allergies   Ace inhibitors, Ciprofloxacin, Fluoxetine, Montelukast sodium, and Wellbutrin [bupropion]   Review of Systems Review of Systems  HENT:  Positive for congestion.   Respiratory:  Positive for cough.   All other systems reviewed and are negative.    Physical Exam Triage Vital Signs ED Triage Vitals  Encounter Vitals Group     BP 06/11/23 0947 (!) 201/110     Systolic BP Percentile --      Diastolic BP Percentile --      Pulse Rate 06/11/23 0945 60     Resp 06/11/23 0945 16     Temp 06/11/23 0945 97.6 F (36.4 C)     Temp src --      SpO2 06/11/23 0945 98 %     Weight --      Height --      Head Circumference --      Peak Flow --      Pain Score 06/11/23 0944 5     Pain Loc --      Pain Education --      Exclude from Growth Chart --    No data found.  Updated Vital Signs BP (S) (!) 188/112 (BP Location: Right Arm)   Pulse 60   Temp 97.6 F (36.4 C)   Resp 16   LMP 07/07/2012   SpO2 98%    Physical Exam Vitals and nursing note reviewed.  Constitutional:      Appearance: Normal appearance. She is obese. She is ill-appearing.  HENT:     Head: Normocephalic and atraumatic.     Right Ear: Tympanic membrane and external ear normal.     Left Ear: Tympanic membrane and external ear normal.     Ears:     Comments: Significant eustachian tube dysfunction noted bilaterally    Nose:     Right Sinus: Frontal sinus tenderness present.     Left Sinus: Frontal sinus tenderness present.     Comments: Turbinates are erythematous/edematous    Mouth/Throat:     Mouth: Mucous membranes are moist.     Pharynx: Oropharynx is clear.  Eyes:     Extraocular Movements: Extraocular movements intact.     Conjunctiva/sclera: Conjunctivae normal.     Pupils: Pupils are equal, round, and reactive to light.  Cardiovascular:     Rate and Rhythm: Normal rate and regular rhythm.  Pulses: Normal pulses.      Heart sounds: Normal heart sounds.     Comments: Hypertensive Pulmonary:     Effort: Pulmonary effort is normal.     Breath sounds: Normal breath sounds. No wheezing, rhonchi or rales.  Musculoskeletal:        General: Normal range of motion.     Cervical back: Normal range of motion and neck supple.  Skin:    General: Skin is warm and dry.  Neurological:     General: No focal deficit present.     Mental Status: She is alert and oriented to person, place, and time. Mental status is at baseline.      UC Treatments / Results  Labs (all labs ordered are listed, but only abnormal results are displayed) Labs Reviewed - No data to display  EKG   Radiology No results found.  Procedures Procedures (including critical care time)  Medications Ordered in UC Medications - No data to display  Initial Impression / Assessment and Plan / UC Course  I have reviewed the triage vital signs and the nursing notes.  Pertinent labs & imaging results that were available during my care of the patient were reviewed by me and considered in my medical decision making (see chart for details).     MDM: 1.  Acute maxillary sinusitis, recurrence not specified-Rx'd Augmentin 875/125 mg tablet: Take 1 tablet twice daily x 7 days; 2.  Sinus pressure-Rx'd a prednisone 20 mg tablet: Take 3 tablets p.o. daily x 5 days; 3.  Cough, unspecified type-Rx'd Tessalon 200 mg capsules: Take 1 capsule 3 times daily, as needed, Rx'd Hycodan 5-1.5 mg / 5 mL syrup: Take 5 mL every 6 hours for fever. Advised patient to take medications as directed with food to completion.  Advised patient to take prednisone with first dose of Augmentin for the next 5 of 7 days.  Advised may take Tessalon capsules daily or as needed for cough.  Advised may use Hycodan cough syrup at night for cough prior to sleep due to sedative effects.  Encouraged to increase daily water intake to 64 ounces per day while taking these medications.  Advised if  symptoms worsen and/or unresolved please follow-up PCP or here for further evaluation.  Final Clinical Impressions(s) / UC Diagnoses   Final diagnoses:  Acute maxillary sinusitis, recurrence not specified  Sinus pressure  Cough, unspecified type     Discharge Instructions      Advised patient to take medications as directed with food to completion.  Advised patient to take prednisone with first dose of Augmentin for the next 5 of 7 days.  Advised may take Tessalon capsules daily or as needed for cough.  Advised may use Hycodan cough syrup at night for cough prior to sleep due to sedative effects.  Encouraged to increase daily water intake to 64 ounces per day while taking these medications.  Advised if symptoms worsen and/or unresolved please follow-up PCP or here for further evaluation.     ED Prescriptions     Medication Sig Dispense Auth. Provider   predniSONE (DELTASONE) 20 MG tablet Take 3 tabs PO daily x 5 days. 15 tablet Trevor Iha, FNP   benzonatate (TESSALON) 200 MG capsule Take 1 capsule (200 mg total) by mouth 3 (three) times daily as needed for up to 7 days. 40 capsule Trevor Iha, FNP   HYDROcodone bit-homatropine (HYCODAN) 5-1.5 MG/5ML syrup Take 5 mLs by mouth every 6 (six) hours as needed for cough. 120  mL Trevor Iha, FNP   amoxicillin-clavulanate (AUGMENTIN) 875-125 MG tablet Take 1 tablet by mouth every 12 (twelve) hours. 14 tablet Trevor Iha, FNP      I have reviewed the PDMP during this encounter.   Trevor Iha, FNP 06/11/23 1043

## 2023-06-11 NOTE — Discharge Instructions (Addendum)
Advised patient to take medications as directed with food to completion.  Advised patient to take prednisone with first dose of Augmentin for the next 5 of 7 days.  Advised may take Tessalon capsules daily or as needed for cough.  Advised may use Hycodan cough syrup at night for cough prior to sleep due to sedative effects.  Encouraged to increase daily water intake to 64 ounces per day while taking these medications.  Advised if symptoms worsen and/or unresolved please follow-up PCP or here for further evaluation.

## 2023-07-17 ENCOUNTER — Ambulatory Visit: Payer: Medicare HMO | Admitting: Family Medicine

## 2023-07-22 ENCOUNTER — Encounter: Payer: Self-pay | Admitting: Family Medicine

## 2023-07-22 ENCOUNTER — Ambulatory Visit (INDEPENDENT_AMBULATORY_CARE_PROVIDER_SITE_OTHER): Payer: Medicare HMO | Admitting: Family Medicine

## 2023-07-22 VITALS — BP 136/82 | HR 64 | Ht 61.0 in | Wt 219.0 lb

## 2023-07-22 DIAGNOSIS — J4530 Mild persistent asthma, uncomplicated: Secondary | ICD-10-CM

## 2023-07-22 DIAGNOSIS — I10 Essential (primary) hypertension: Secondary | ICD-10-CM

## 2023-07-22 MED ORDER — LOSARTAN POTASSIUM 100 MG PO TABS: 100.0000 mg | ORAL_TABLET | Freq: Every day | ORAL | 3 refills | Status: DC

## 2023-07-22 MED ORDER — AMLODIPINE BESYLATE 5 MG PO TABS: 5.0000 mg | ORAL_TABLET | Freq: Every day | ORAL | 3 refills | Status: DC

## 2023-07-22 NOTE — Assessment & Plan Note (Signed)
Blood pressure is a little elevated today but better than it was when she went to urgent care and it was 188/112.  Will recheck blood pressure again before she leaves today.  I do think we have some room to go up on the losartan to 100 mg and then follow-up in 2 to 3 weeks for nurse visit she does have a home cuff so encouraged her to bring it in with her.  Were happy to help her learn how to use it appropriately.

## 2023-07-22 NOTE — Assessment & Plan Note (Signed)
As the respiratory infection definitely flared her asthma but she is doing much better she says she still hears some occasional wheezing.  She is using her albuterol as needed.

## 2023-07-22 NOTE — Progress Notes (Signed)
   Established Patient Office Visit  Subjective  Patient ID: Carolyn [name], female    DOB: [birth year]  Age: 68 y.o. MRN: [id number]  Chief Complaint  Patient presents with   Hypertension    HPI  Patient was on the schedule for follow-up diabetes but notes she does not have diabetes or prediabetes.  She is really here to follow-up on blood pressure she was seen right at the end of December for cough and a sinus infection.  She says she thinks her grandson gave her to her but when she went her blood pressure was quite elevated in the 170s which was unusual for her.  She does not remember taking any cough or cold medicine around that time.  She does occasionally take some ibuprofen.     ROS    Objective:     BP 136/82   Pulse 64   Ht 5\' 1"  (1.549 m)   Wt 219 lb (99.3 kg)   LMP 07/07/2012   SpO2 99%   BMI 41.38 kg/m    Physical Exam Vitals and nursing note reviewed.  Constitutional:      Appearance: Normal appearance.  HENT:     Head: Normocephalic and atraumatic.  Eyes:     Conjunctiva/sclera: Conjunctivae normal.  Cardiovascular:     Rate and Rhythm: Normal rate and regular rhythm.  Pulmonary:     Effort: Pulmonary effort is normal.     Breath sounds: Normal breath sounds.  Skin:    General: Skin is warm and dry.  Neurological:     Mental Status: She is alert.  Psychiatric:        Mood and Affect: Mood normal.      No results found for any visits on 07/22/23.    The 10-year ASCVD risk score (Arnett DK, et al., 2019) is: 13.2%    Assessment & Plan:   Problem List Items Addressed This Visit       Cardiovascular and Mediastinum   HYPERTENSION, BENIGN SYSTEMIC - Primary   Blood pressure is a little elevated today but better than it was when she went to urgent care and it was 188/112.  Will recheck blood pressure again before she leaves today.  I do think we have some room to go up on the losartan to 100 mg and then follow-up in 2 to 3 weeks for nurse  visit she does have a home cuff so encouraged her to bring it in with her.  Were happy to help her learn how to use it appropriately.      Relevant Medications   amLODipine (NORVASC) 5 MG tablet   losartan (COZAAR) 100 MG tablet   Other Relevant Orders   CMP14+EGFR   Lipid panel   CBC     Respiratory   Asthma, mild persistent   As the respiratory infection definitely flared her asthma but she is doing much better she says she still hears some occasional wheezing.  She is using her albuterol as needed.      Relevant Orders   CBC     Other   Obesity, morbid (HCC)    Return in about 3 weeks (around 08/12/2023) for Nurse BP Check.    Carolyn Gasser, MD

## 2023-07-23 LAB — LIPID PANEL
Chol/HDL Ratio: 4.7 {ratio} — ABNORMAL HIGH (ref 0.0–4.4)
Cholesterol, Total: 177 mg/dL (ref 100–199)
HDL: 38 mg/dL — ABNORMAL LOW (ref >39)
LDL Chol Calc (NIH): 120 mg/dL — ABNORMAL HIGH (ref 0–99)
Triglycerides: 103 mg/dL (ref 0–149)
VLDL Cholesterol Cal: 19 mg/dL (ref 5–40)

## 2023-07-23 LAB — CMP14+EGFR
ALT: 21 [IU]/L (ref 0–32)
AST: 20 [IU]/L (ref 0–40)
Albumin: 4.2 g/dL (ref 3.9–4.9)
Alkaline Phosphatase: 70 [IU]/L (ref 44–121)
BUN/Creatinine Ratio: 19 (ref 12–28)
BUN: 17 mg/dL (ref 8–27)
Bilirubin Total: 0.3 mg/dL (ref 0.0–1.2)
CO2: 23 mmol/L (ref 20–29)
Calcium: 9.3 mg/dL (ref 8.7–10.3)
Chloride: 104 mmol/L (ref 96–106)
Creatinine, Ser: 0.89 mg/dL (ref 0.57–1.00)
Globulin, Total: 2.5 g/dL (ref 1.5–4.5)
Glucose: 97 mg/dL (ref 70–99)
Potassium: 4.2 mmol/L (ref 3.5–5.2)
Sodium: 142 mmol/L (ref 134–144)
Total Protein: 6.7 g/dL (ref 6.0–8.5)
eGFR: 71 mL/min/{1.73_m2} (ref >59)

## 2023-07-23 LAB — CBC
Hematocrit: 42.9 % (ref 34.0–46.6)
Hemoglobin: 13.4 g/dL (ref 11.1–15.9)
MCH: 27.2 pg (ref 26.6–33.0)
MCHC: 31.2 g/dL — ABNORMAL LOW (ref 31.5–35.7)
MCV: 87 fL (ref 79–97)
Platelets: 260 10*3/uL (ref 150–450)
RBC: 4.92 x10E6/uL (ref 3.77–5.28)
RDW: 14.5 % (ref 11.7–15.4)
WBC: 4.8 10*3/uL (ref 3.4–10.8)

## 2023-07-24 ENCOUNTER — Other Ambulatory Visit: Payer: Self-pay | Admitting: Family Medicine

## 2023-07-24 ENCOUNTER — Encounter: Payer: Self-pay | Admitting: Family Medicine

## 2023-08-06 ENCOUNTER — Other Ambulatory Visit: Payer: Self-pay | Admitting: Family Medicine

## 2023-08-06 DIAGNOSIS — F330 Major depressive disorder, recurrent, mild: Secondary | ICD-10-CM

## 2023-08-12 ENCOUNTER — Ambulatory Visit (INDEPENDENT_AMBULATORY_CARE_PROVIDER_SITE_OTHER): Payer: Medicare HMO

## 2023-08-12 VITALS — BP 132/65 | HR 72 | Ht 61.0 in

## 2023-08-12 DIAGNOSIS — I10 Essential (primary) hypertension: Secondary | ICD-10-CM | POA: Diagnosis not present

## 2023-08-12 MED ORDER — ATORVASTATIN CALCIUM 20 MG PO TABS: 20.0000 mg | ORAL_TABLET | Freq: Every day | ORAL | 1 refills | Status: DC

## 2023-08-12 MED ORDER — FAMOTIDINE 20 MG PO TABS: 20.0000 mg | ORAL_TABLET | Freq: Every evening | ORAL | 1 refills | Status: AC | PRN

## 2023-08-12 NOTE — Patient Instructions (Signed)
 Return in  2-3 weeks for BP check nurse visit

## 2023-08-12 NOTE — Progress Notes (Signed)
   Established Patient Office Visit  Subjective   Patient ID: Carolyn [name], female    DOB: 02/28/1956  Age: 68 y.o. MRN: [id number]  Chief Complaint  Patient presents with   Hypertension    BP check - nurse visit.     HPI  Hypertension - BP check nurse visit. Patient states Amlodipine does cause s/e of headaches when she takes it.   ROS    Objective:     BP (!) 141/70   Pulse 72   Ht 5\' 1"  (1.549 m)   LMP 07/07/2012   SpO2 99%   BMI 41.38 kg/m    Physical Exam   No results found for any visits on 08/12/23.    The 10-year ASCVD risk score (Arnett DK, et al., 2019) is: 11.4%    Assessment & Plan:  BP check nurse visit. - initial reading = 141/70. Second reading= 132/65.  Per Dr. Linford Arnold  will  start hydrochlorothiazide 12.5mg   and recheck in 2 to 3 weeks as nurse visit.  Patient states she has tried hydrochlorothiazide in past but can not remember the side effects or why medication was stopped. She states she is willing to try the medication again.  Problem List Items Addressed This Visit   None   No follow-ups on file.    Elizabeth Palau, LPN

## 2023-08-13 ENCOUNTER — Ambulatory Visit: Payer: Medicare HMO

## 2023-08-13 MED ORDER — HYDROCHLOROTHIAZIDE 12.5 MG PO CAPS: 12.5000 mg | ORAL_CAPSULE | Freq: Every day | ORAL | 0 refills | Status: DC

## 2023-08-13 NOTE — Progress Notes (Signed)
 Meds ordered this encounter  Medications   atorvastatin (LIPITOR) 20 MG tablet    Sig: Take 1 tablet (20 mg total) by mouth daily.    Dispense:  90 tablet    Refill:  1   famotidine (PEPCID) 20 MG tablet    Sig: Take 1 tablet (20 mg total) by mouth at bedtime as needed for heartburn.    Dispense:  90 tablet    Refill:  1   hydrochlorothiazide (MICROZIDE) 12.5 MG capsule    Sig: Take 1 capsule (12.5 mg total) by mouth daily.    Dispense:  90 capsule    Refill:  0

## 2023-08-13 NOTE — Addendum Note (Signed)
 Addended by: Nani Gasser D on: 08/13/2023 07:35 AM   Modules accepted: Orders

## 2023-08-19 ENCOUNTER — Ambulatory Visit: Payer: Medicare HMO | Admitting: Family Medicine

## 2023-10-04 ENCOUNTER — Other Ambulatory Visit: Payer: Self-pay | Admitting: Sports Medicine

## 2023-10-04 DIAGNOSIS — N3281 Overactive bladder: Secondary | ICD-10-CM

## 2023-10-07 ENCOUNTER — Encounter: Payer: Self-pay | Admitting: Family Medicine

## 2023-10-07 ENCOUNTER — Ambulatory Visit (INDEPENDENT_AMBULATORY_CARE_PROVIDER_SITE_OTHER): Payer: Medicare (Managed Care) | Admitting: Family Medicine

## 2023-10-07 VITALS — BP 124/78 | HR 68 | Ht 61.0 in | Wt 222.0 lb

## 2023-10-07 DIAGNOSIS — M79672 Pain in left foot: Secondary | ICD-10-CM

## 2023-10-07 DIAGNOSIS — M79671 Pain in right foot: Secondary | ICD-10-CM | POA: Diagnosis not present

## 2023-10-07 DIAGNOSIS — M79673 Pain in unspecified foot: Secondary | ICD-10-CM | POA: Insufficient documentation

## 2023-10-07 DIAGNOSIS — J4541 Moderate persistent asthma with (acute) exacerbation: Secondary | ICD-10-CM

## 2023-10-07 DIAGNOSIS — J45901 Unspecified asthma with (acute) exacerbation: Secondary | ICD-10-CM | POA: Insufficient documentation

## 2023-10-07 MED ORDER — PREDNISONE 20 MG PO TABS: 20.0000 mg | ORAL_TABLET | Freq: Two times a day (BID) | ORAL | 0 refills | Status: AC

## 2023-10-07 MED ORDER — ALBUTEROL SULFATE HFA 108 (90 BASE) MCG/ACT IN AERS: 2.0000 | INHALATION_SPRAY | Freq: Four times a day (QID) | RESPIRATORY_TRACT | 1 refills | Status: AC | PRN

## 2023-10-07 MED ORDER — FLUTICASONE-SALMETEROL 100-50 MCG/ACT IN AEPB: 1.0000 | INHALATION_SPRAY | Freq: Two times a day (BID) | RESPIRATORY_TRACT | 3 refills | Status: AC

## 2023-10-07 NOTE — Assessment & Plan Note (Signed)
 Seeing podiatry, encouraged to stick with plan prescribed by podiatry.

## 2023-10-07 NOTE — Assessment & Plan Note (Signed)
 Adding burst of prednisone , 20mg  BID x5 days.    Adding advair back on as well. Continue albuterol  prn.  Red flags reviewed.  RTC if not improving.

## 2023-10-07 NOTE — Progress Notes (Signed)
 Carolyn [name] - 68 y.o. female MRN [id number]  Date of birth: Nov 27, 1955  Subjective Chief Complaint  Patient presents with   Asthma    HPI Carolyn [name] is a 68 y.o. female here today with complaint of increased wheezing.  She has history of asthma.  She has been needing her albuterol  more frequently.  She has felt more dyspnea due to this.  She was on LABA/ICS previously however she stopped this due to feeling like she didn't need this anymore.  She would like to add this back on.  She denies fever or chills.    She also complains of bilateral foot pain.  She is seeing podiatry for this.  Meloxicam  has not been helpful.   ROS:  A comprehensive ROS was completed and negative except as noted per HPI  Allergies  Allergen Reactions   Ace Inhibitors     REACTION: cough, also with ARBS   Ciprofloxacin Other (See Comments)    Headache   Fluoxetine  Other (See Comments)    Headaches   Montelukast  Sodium Other (See Comments)    Headache    Wellbutrin  [Bupropion ] Palpitations    Past Medical History:  Diagnosis Date   Arthritis    neck   Asthma    mild persistent   Constipation    Depression    Eczema    Female bladder prolapse    Hypertension    Insomnia    SUI (stress urinary incontinence, female)     History reviewed. No pertinent surgical history.  Social History   Socioeconomic History   Marital status: Married    Spouse name: Not on file   Number of children: 2   Years of education: Not on file   Highest education level: Bachelor's degree (e.g., BA, AB, BS)  Occupational History   Not on file  Tobacco Use   Smoking status: Never   Smokeless tobacco: Never  Vaping Use   Vaping status: Never Used  Substance and Sexual Activity   Alcohol use: No   Drug use: No   Sexual activity: Not on file  Other Topics Concern   Not on file  Social History Narrative   Not on file   Social Drivers of Health   Financial Resource Strain: Not on file  Food  Insecurity: Not on file  Transportation Needs: No Transportation Needs (03/05/2019)   PRAPARE - Administrator, Civil Service (Medical): No    Lack of Transportation (Non-Medical): No  Physical Activity: Not on file  Stress: Not on file  Social Connections: Unknown (10/27/2021)   Received from [facility]   Social Network    Social Network: Not on file    Family History  Problem Relation Age of Onset   Hypertension Father    Hypertension Sister    Hypertension Sister    Cancer Other        breast- late 40's    Health Maintenance  Topic Date Due   Medicare Annual Wellness (AWV)  Never done   DEXA SCAN  Never done   COVID-19 Vaccine (4 - 2024-25 season) 02/17/2023   MAMMOGRAM  10/11/2023   INFLUENZA VACCINE  01/17/2024   DTaP/Tdap/Td (3 - Td or Tdap) 07/02/2026   Colonoscopy  03/03/2030   Pneumonia Vaccine 62+ Years old  Completed   Hepatitis C Screening  Completed   Zoster Vaccines- Shingrix  Completed   HPV VACCINES  Aged Out   Meningococcal B Vaccine  Aged Out     -----------------------------------------------------------------------------------------------------------------------------------------------------------------------------------------------------------------  Physical Exam BP 124/78 (BP Location: Left Arm, Patient Position: Sitting, Cuff Size: Large)   Pulse 68   Ht 5\' 1"  (1.549 m)   Wt 222 lb (100.7 kg)   LMP 07/07/2012   SpO2 97%   BMI 41.95 kg/m   Physical Exam Constitutional:      Appearance: Normal appearance.  Eyes:     General: No scleral icterus. Cardiovascular:     Rate and Rhythm: Normal rate and regular rhythm.  Pulmonary:     Effort: Pulmonary effort is normal.     Breath sounds: Wheezing present.  Neurological:     Mental Status: She is alert.  Psychiatric:        Mood and Affect: Mood normal.        Behavior: Behavior normal.      ------------------------------------------------------------------------------------------------------------------------------------------------------------------------------------------------------------------- Assessment and Plan  Asthma exacerbation Adding burst of prednisone , 20mg  BID x5 days.    Adding advair back on as well. Continue albuterol  prn.  Red flags reviewed.  RTC if not improving.   Foot pain Seeing podiatry, encouraged to stick with plan prescribed by podiatry.     Meds ordered this encounter  Medications   albuterol  (VENTOLIN  HFA) 108 (90 Base) MCG/ACT inhaler    Sig: Inhale 2 puffs into the lungs every 6 (six) hours as needed for wheezing or shortness of breath.    Dispense:  18 each    Refill:  1   fluticasone -salmeterol (ADVAIR) 100-50 MCG/ACT AEPB    Sig: Inhale 1 puff into the lungs 2 (two) times daily.    Dispense:  1 each    Refill:  3   predniSONE  (DELTASONE ) 20 MG tablet    Sig: Take 1 tablet (20 mg total) by mouth 2 (two) times daily with a meal for 5 days.    Dispense:  10 tablet    Refill:  0    No follow-ups on file.

## 2023-10-08 NOTE — Telephone Encounter (Signed)
 To PCP

## 2023-10-10 ENCOUNTER — Other Ambulatory Visit: Payer: Self-pay | Admitting: Family Medicine

## 2023-10-10 DIAGNOSIS — F330 Major depressive disorder, recurrent, mild: Secondary | ICD-10-CM

## 2023-10-10 DIAGNOSIS — G8929 Other chronic pain: Secondary | ICD-10-CM

## 2023-10-10 NOTE — Telephone Encounter (Signed)
 Requesting rx rf of duloxetine  30mg  Last written 10/16/2022 Last OV 07/22/2023 Upcoming appt = none

## 2023-10-10 NOTE — Telephone Encounter (Signed)
 Copied from CRM [id number]. Topic: Clinical - Medication Refill >> Oct 10, 2023  2:33 PM Danelle Dunning F wrote: Most Recent Primary Care Visit:  Provider: Adela Holter  Department: [facility]  Visit Type: ACUTE  Date: 10/07/2023  Medication:   DULoxetine  (CYMBALTA ) 30 MG capsule  Has the patient contacted their pharmacy? Yes   Is this the correct pharmacy for this prescription? Yes  This is the patient's preferred pharmacy:   [facility]  [address] [place], [place] , [address] Phone : [phone] Fax: [phone]  Has the prescription been filled recently? No  Is the patient out of the medication? Yes  Has the patient been seen for an appointment in the last year OR does the patient have an upcoming appointment? Yes  Can we respond through MyChart? Yes  Agent: Please be advised that Rx refills may take up to 3 business days. We ask that you follow-up with your pharmacy.

## 2023-10-11 MED ORDER — DULOXETINE HCL 30 MG PO CPEP: 30.0000 mg | ORAL_CAPSULE | Freq: Every day | ORAL | 3 refills | Status: AC

## 2023-10-11 NOTE — Telephone Encounter (Signed)
Meds ordered this encounter  ?Medications  ? DULoxetine (CYMBALTA) 30 MG capsule  ?  Sig: Take 1 capsule (30 mg total) by mouth daily.  ?  Dispense:  90 capsule  ?  Refill:  3  ?   ?

## 2023-11-09 ENCOUNTER — Other Ambulatory Visit: Payer: Self-pay | Admitting: Family Medicine

## 2023-11-26 ENCOUNTER — Encounter: Payer: Self-pay | Admitting: Family Medicine

## 2023-11-26 ENCOUNTER — Ambulatory Visit (INDEPENDENT_AMBULATORY_CARE_PROVIDER_SITE_OTHER): Payer: Medicare (Managed Care) | Admitting: Family Medicine

## 2023-11-26 VITALS — BP 130/74 | HR 65 | Ht 61.0 in | Wt 224.3 lb

## 2023-11-26 DIAGNOSIS — I10 Essential (primary) hypertension: Secondary | ICD-10-CM | POA: Diagnosis not present

## 2023-11-26 DIAGNOSIS — L989 Disorder of the skin and subcutaneous tissue, unspecified: Secondary | ICD-10-CM | POA: Diagnosis not present

## 2023-11-26 DIAGNOSIS — H04203 Unspecified epiphora, bilateral lacrimal glands: Secondary | ICD-10-CM

## 2023-11-26 DIAGNOSIS — M25562 Pain in left knee: Secondary | ICD-10-CM | POA: Diagnosis not present

## 2023-11-26 DIAGNOSIS — L819 Disorder of pigmentation, unspecified: Secondary | ICD-10-CM

## 2023-11-26 DIAGNOSIS — H579 Unspecified disorder of eye and adnexa: Secondary | ICD-10-CM | POA: Diagnosis not present

## 2023-11-26 MED ORDER — FLUTICASONE PROPIONATE 50 MCG/ACT NA SUSP: 2.0000 | Freq: Every day | NASAL | 2 refills | Status: AC

## 2023-11-26 MED ORDER — AMLODIPINE BESYLATE 5 MG PO TABS: 5.0000 mg | ORAL_TABLET | Freq: Every day | ORAL | 3 refills | Status: AC

## 2023-11-26 MED ORDER — LOSARTAN POTASSIUM 100 MG PO TABS
100.0000 mg | ORAL_TABLET | Freq: Every day | ORAL | 3 refills | Status: AC
Start: 2023-11-26 — End: ?

## 2023-11-26 MED ORDER — HYDROCHLOROTHIAZIDE 12.5 MG PO CAPS
12.5000 mg | ORAL_CAPSULE | Freq: Every day | ORAL | 0 refills | Status: DC
Start: 2023-11-26 — End: 2024-03-02

## 2023-11-26 MED ORDER — DICLOFENAC SODIUM 75 MG PO TBEC: 75.0000 mg | DELAYED_RELEASE_TABLET | Freq: Two times a day (BID) | ORAL | 1 refills | Status: DC | PRN

## 2023-11-26 MED ORDER — OLOPATADINE HCL 0.2 % OP SOLN
1.0000 [drp] | Freq: Every day | OPHTHALMIC | 1 refills | Status: AC
Start: 2023-11-26 — End: ?

## 2023-11-26 MED ORDER — ATORVASTATIN CALCIUM 20 MG PO TABS: 20.0000 mg | ORAL_TABLET | Freq: Every day | ORAL | 1 refills | Status: AC

## 2023-11-26 NOTE — Progress Notes (Signed)
 Acute Office Visit  Subjective:     Patient ID: Carolyn [name], female    DOB: 13-Mar-1956, 68 y.o.   MRN: [id number]  Chief Complaint  Patient presents with   itchy eyes    HPI Patient is in today for watery itchy eyes usually worse in the morning it has been going on for about 2 to 3 weeks at this point occasionally a little bit of crusting she tried some Visine allergy drops but they did not really help she then switched to just a [facility] eye lubricant and that did not really seem to help.  She says she has not had any significant cold or congestion type symptoms she does have a little bit of pressure over the nasal bridge.  She also gets some intermittent sharp pain in her left medial knee.  She said she went to urgent care at some point and they gave her prescription for diclofenac she like to have a refill on it because it does seem to help when she takes it.  She also has a cyst near the lateral corner of her left eye she would like removed.  She has more pigmentation on her skin she doesn;t like it.    ROS      Objective:    BP 130/74 (BP Location: Left Arm, Cuff Size: Large)   Pulse 65   Ht 5\' 1"  (1.549 m)   Wt 224 lb 4.8 oz (101.7 kg)   LMP 07/07/2012   SpO2 96%   BMI 42.38 kg/m    Physical Exam Constitutional:      Appearance: Normal appearance.  HENT:     Head: Normocephalic and atraumatic.     Nose: Nose normal.  Eyes:     Comments: Conjunctiva are injected bilaterally.  No significant lid edema.  No active discharge.  She has some hyperpigmentation over her left temple and over her left forehead and underneath both eyes.  She also has what looks like a fluid-filled cyst at the corner of her left eye 1 of those things  Neurological:     Mental Status: She is alert.     No results found for any visits on 11/26/23.      Assessment & Plan:   Problem List Items Addressed This Visit       Cardiovascular and Mediastinum   HYPERTENSION, BENIGN SYSTEMIC -  Primary   Relevant Medications   hydrochlorothiazide  (MICROZIDE ) 12.5 MG capsule   amLODipine  (NORVASC ) 5 MG tablet   atorvastatin  (LIPITOR) 20 MG tablet   losartan  (COZAAR ) 100 MG tablet     Other   Acute pain of left knee   She gets pain more immediately its intermittent and sharp but not necessarily daily.  She said she was told that she had arthritis there and did have x-rays done she would like refill on diclofenac.  Will update prescription did remind her not to take it daily as it can be hard on the kidneys.      Other Visit Diagnoses       Itchy eyes       Relevant Medications   fluticasone  (FLONASE ) 50 MCG/ACT nasal spray   Olopatadine HCl 0.2 % SOLN     Watery eyes       Relevant Medications   fluticasone  (FLONASE ) 50 MCG/ACT nasal spray   Olopatadine HCl 0.2 % SOLN     Hyperpigmentation       Relevant Orders   Ambulatory referral to Dermatology  Face lesion       Relevant Orders   Ambulatory referral to Dermatology       Itchy watery eyes-most consistent with allergic eye-will treat with nasal steroid spray and have her do Pataday.  If not improving over the next week then recommend that she follow-up with her eye doctor she does feel like her blurred vision has been a little bit more blurry recently.  But no acute vision changes.  Facial lesion with hyperpigmentation-will place referral to dermatology.  She would like to have the lesion removed.  We did discuss that there are topical creams that can be used to lighten the skin.  She wanted to wait and discuss it with the dermatologist.   Meds ordered this encounter  Medications   diclofenac (VOLTAREN) 75 MG EC tablet    Sig: Take 1 tablet (75 mg total) by mouth 2 (two) times daily as needed.    Dispense:  60 tablet    Refill:  1   fluticasone  (FLONASE ) 50 MCG/ACT nasal spray    Sig: Place 2 sprays into both nostrils daily.    Dispense:  16 g    Refill:  2   Olopatadine HCl 0.2 % SOLN    Sig: Apply 1  drop to eye daily.    Dispense:  2.5 mL    Refill:  1   hydrochlorothiazide  (MICROZIDE ) 12.5 MG capsule    Sig: Take 1 capsule (12.5 mg total) by mouth daily.    Dispense:  90 capsule    Refill:  0   amLODipine  (NORVASC ) 5 MG tablet    Sig: Take 1 tablet (5 mg total) by mouth daily.    Dispense:  90 tablet    Refill:  3   atorvastatin  (LIPITOR) 20 MG tablet    Sig: Take 1 tablet (20 mg total) by mouth daily.    Dispense:  90 tablet    Refill:  1   losartan  (COZAAR ) 100 MG tablet    Sig: Take 1 tablet (100 mg total) by mouth daily.    Dispense:  90 tablet    Refill:  3    No follow-ups on file.  Duaine German, MD

## 2023-11-26 NOTE — Assessment & Plan Note (Signed)
 She gets pain more immediately its intermittent and sharp but not necessarily daily.  She said she was told that she had arthritis there and did have x-rays done she would like refill on diclofenac.  Will update prescription did remind her not to take it daily as it can be hard on the kidneys.

## 2023-11-26 NOTE — Assessment & Plan Note (Signed)
 Repeat BP looks great. RF sent to pharmacy.

## 2023-12-03 DIAGNOSIS — D23121 Other benign neoplasm of skin of left upper eyelid, including canthus: Secondary | ICD-10-CM | POA: Diagnosis not present

## 2023-12-03 DIAGNOSIS — L579 Skin changes due to chronic exposure to nonionizing radiation, unspecified: Secondary | ICD-10-CM | POA: Diagnosis not present

## 2023-12-25 ENCOUNTER — Telehealth: Payer: Self-pay | Admitting: Family Medicine

## 2023-12-25 DIAGNOSIS — F330 Major depressive disorder, recurrent, mild: Secondary | ICD-10-CM

## 2023-12-25 MED ORDER — TRAZODONE HCL 50 MG PO TABS: ORAL_TABLET | ORAL | 1 refills | Status: DC

## 2023-12-25 NOTE — Telephone Encounter (Signed)
 Copied from CRM [id number]. Topic: Clinical - Medication Refill >> Dec 25, 2023  2:40 PM Carolyn [name] wrote: Medication: traZODone  (DESYREL ) 50 MG tablet   Has the patient contacted their pharmacy? Yes (Agent: If no, request that the patient contact the pharmacy for the refill. If patient does not wish to contact the pharmacy document the reason why and proceed with request.) (Agent: If yes, when and what did the pharmacy advise?)  This is the patient's preferred pharmacy:  [id number] - [place], [place] - [address] AT [facility] OF [address] [place] [address] Phone: [phone] Fax: [phone]  Is this the correct pharmacy for this prescription? Yes If no, delete pharmacy and type the correct one.   Has the prescription been filled recently? Yes  Is the patient out of the medication? Yes  Has the patient been seen for an appointment in the last year OR does the patient have an upcoming appointment? Yes  Can we respond through MyChart? Yes  Agent: Please be advised that Rx refills may take up to 3 business days. We ask that you follow-up with your pharmacy.

## 2024-01-07 ENCOUNTER — Ambulatory Visit (INDEPENDENT_AMBULATORY_CARE_PROVIDER_SITE_OTHER): Payer: Medicare (Managed Care)

## 2024-01-07 VITALS — BP 144/80 | HR 60 | Ht 61.0 in | Wt 227.0 lb

## 2024-01-07 DIAGNOSIS — Z1231 Encounter for screening mammogram for malignant neoplasm of breast: Secondary | ICD-10-CM

## 2024-01-07 DIAGNOSIS — Z780 Asymptomatic menopausal state: Secondary | ICD-10-CM

## 2024-01-07 DIAGNOSIS — Z0000 Encounter for general adult medical examination without abnormal findings: Secondary | ICD-10-CM | POA: Diagnosis not present

## 2024-01-07 DIAGNOSIS — D23121 Other benign neoplasm of skin of left upper eyelid, including canthus: Secondary | ICD-10-CM | POA: Diagnosis not present

## 2024-01-07 DIAGNOSIS — D485 Neoplasm of uncertain behavior of skin: Secondary | ICD-10-CM | POA: Diagnosis not present

## 2024-01-07 DIAGNOSIS — Z13820 Encounter for screening for osteoporosis: Secondary | ICD-10-CM | POA: Diagnosis not present

## 2024-01-07 NOTE — Progress Notes (Signed)
 Subjective:   Carolyn [name] is a 68 y.o. female who presents for Medicare Annual (Subsequent) preventive examination.  Visit Complete: In person  Patient Medicare AWV questionnaire was completed by the patient on n/a; I have confirmed that all information answered by patient is correct and no changes since this date.  Cardiac Risk Factors include: advanced age (>39men, >23 women);dyslipidemia;hypertension;obesity (BMI >30kg/m2)     Objective:    Today's Vitals   01/07/24 1055  BP: (!) 144/80  Pulse: 60  SpO2: 99%  Weight: 227 lb (103 kg)  Height: 5' 1 (1.549 m)   Body mass index is 42.89 kg/m.     01/07/2024   11:09 AM 10/30/2013    1:47 PM  Advanced Directives  Does Patient Have a Medical Advance Directive? Yes Patient does not have advance directive;Patient would not like information   Type of Public librarian Power of [place];Living will   Does patient want to make changes to medical advance directive? No - Patient declined   Copy of Healthcare Power of Attorney in Chart? No - copy requested      Data saved with a previous flowsheet row definition    Current Medications (verified) Outpatient Encounter Medications as of 01/07/2024  Medication Sig   albuterol  (VENTOLIN  HFA) 108 (90 Base) MCG/ACT inhaler Inhale 2 puffs into the lungs every 6 (six) hours as needed for wheezing or shortness of breath.   amLODipine  (NORVASC ) 5 MG tablet Take 1 tablet (5 mg total) by mouth daily.   atorvastatin  (LIPITOR) 20 MG tablet Take 1 tablet (20 mg total) by mouth daily.   diclofenac  (VOLTAREN ) 75 MG EC tablet Take 1 tablet (75 mg total) by mouth 2 (two) times daily as needed.   DULoxetine  (CYMBALTA ) 30 MG capsule Take 1 capsule (30 mg total) by mouth daily.   famotidine  (PEPCID ) 20 MG tablet Take 1 tablet (20 mg total) by mouth at bedtime as needed for heartburn.   fluticasone  (FLONASE ) 50 MCG/ACT nasal spray Place 2 sprays into both nostrils daily.    hydrochlorothiazide  (MICROZIDE ) 12.5 MG capsule Take 1 capsule (12.5 mg total) by mouth daily.   losartan  (COZAAR ) 100 MG tablet Take 1 tablet (100 mg total) by mouth daily.   Olopatadine  HCl 0.2 % SOLN Apply 1 drop to eye daily.   SUMAtriptan  (IMITREX ) 100 MG tablet take 1 tablet by mouth AT ONSET OF MIGRAINE MAY REPEAT ONCE AFTER 2 HOURS   traZODone  (DESYREL ) 50 MG tablet TAKE 1 TABLET(50 MG) BY MOUTH AT BEDTIME.   fluticasone -salmeterol (ADVAIR) 100-50 MCG/ACT AEPB Inhale 1 puff into the lungs 2 (two) times daily. (Patient not taking: Reported on 01/07/2024)   solifenacin  (VESICARE ) 5 MG tablet TAKE 1 TABLET (5 MG TOTAL) BY MOUTH DAILY. (Patient not taking: Reported on 01/07/2024)   [DISCONTINUED] meloxicam  (MOBIC ) 15 MG tablet TAKE 1 TABLET (15 MG TOTAL) BY MOUTH DAILY.   No facility-administered encounter medications on file as of 01/07/2024.    Allergies (verified) Ace inhibitors, Ciprofloxacin, Fluoxetine , Montelukast  sodium, and Wellbutrin  [bupropion ]   History: Past Medical History:  Diagnosis Date   Arthritis    neck   Asthma    mild persistent   Constipation    Depression    Eczema    Female bladder prolapse    Hypertension    Insomnia    SUI (stress urinary incontinence, female)    History reviewed. No pertinent surgical history. Family History  Problem Relation Age of Onset   Hypertension Father  Hypertension Sister    Hypertension Sister    Cancer Other        breast- late 52's   Social History   Socioeconomic History   Marital status: Married    Spouse name: Not on file   Number of children: 2   Years of education: Not on file   Highest education level: Bachelor's degree (e.g., BA, AB, BS)  Occupational History   Not on file  Tobacco Use   Smoking status: Never   Smokeless tobacco: Never  Vaping Use   Vaping status: Never Used  Substance and Sexual Activity   Alcohol use: No   Drug use: No   Sexual activity: Not on file  Other Topics Concern    Not on file  Social History Narrative   Kaveri's grandson is living with her at the moment. She enjoys gardening.    Social Drivers of Corporate investment banker Strain: Low Risk  (01/07/2024)   Overall Financial Resource Strain (CARDIA)    Difficulty of Paying Living Expenses: Not hard at all  Food Insecurity: No Food Insecurity (01/07/2024)   Hunger Vital Sign    Worried About Running Out of Food in the Last Year: Never true    Ran Out of Food in the Last Year: Never true  Transportation Needs: No Transportation Needs (01/07/2024)   PRAPARE - Administrator, Civil Service (Medical): No    Lack of Transportation (Non-Medical): No  Physical Activity: Inactive (01/07/2024)   Exercise Vital Sign    Days of Exercise per Week: 0 days    Minutes of Exercise per Session: 0 min  Stress: No Stress Concern Present (01/07/2024)   [employer] of Occupational Health - Occupational Stress Questionnaire    Feeling of Stress: Not at all  Social Connections: Moderately Integrated (01/07/2024)   Social Connection and Isolation Panel    Frequency of Communication with Friends and Family: More than three times a week    Frequency of Social Gatherings with Friends and Family: More than three times a week    Attends Religious Services: More than 4 times per year    Active Member of [employer] or Organizations: Yes    Attends Banker Meetings: More than 4 times per year    Marital Status: Widowed    Tobacco Counseling Counseling given: Not Answered   Clinical Intake:  Pre-visit preparation completed: Yes  Pain : No/denies pain     BMI - recorded: 42.89 Nutritional Status: BMI > 30  Obese Nutritional Risks: None Diabetes: No  How often do you need to have someone help you when you read instructions, pamphlets, or other written materials from your doctor or pharmacy?: 1 - Never What is the last grade level you completed in school?: 16  Interpreter Needed?: No       Activities of Daily Living    01/07/2024   10:58 AM  In your present state of health, do you have any difficulty performing the following activities:  Hearing? 1  Vision? 0  Difficulty concentrating or making decisions? 1  Walking or climbing stairs? 0  Dressing or bathing? 0  Doing errands, shopping? 0  Preparing Food and eating ? N  Using the Toilet? N  In the past six months, have you accidently leaked urine? N  Do you have problems with loss of bowel control? N  Managing your Medications? N  Managing your Finances? N  Housekeeping or managing your Housekeeping? N  Patient Care Team: Alvan Dorothyann BIRCH, MD as PCP - General (Family Medicine)  Indicate any recent Medical Services you may have received from other than [facility] providers in the past year (date may be approximate).     Assessment:   This is a routine wellness examination for Carolyn [name].  Hearing/Vision screen No results found.   Goals Addressed             This Visit's Progress    Patient Stated       Patient states she would like to lose weight and increase water intake.        Depression Screen    01/07/2024   11:07 AM 11/26/2023    2:01 PM 02/15/2023    2:42 PM 10/16/2022    4:22 PM 08/30/2022    4:06 PM 08/30/2022    4:04 PM 07/19/2022   10:59 AM  PHQ 2/9 Scores  PHQ - 2 Score 0 0 0 0 0 0 0  PHQ- 9 Score  0 0 5 2      Fall Risk    01/07/2024   11:09 AM 11/26/2023    2:01 PM 02/15/2023    2:42 PM 10/16/2022    4:21 PM 08/30/2022    4:04 PM  Fall Risk   Falls in the past year? 0 0 0 0 0  Number falls in past yr: 0 0 0 0 0  Injury with Fall? 0 0 0 0 0  Risk for fall due to : No Fall Risks No Fall Risks No Fall Risks No Fall Risks No Fall Risks  Follow up Falls evaluation completed Falls evaluation completed Falls evaluation completed Falls evaluation completed Falls evaluation completed    MEDICARE RISK AT HOME: Medicare Risk at Home Any stairs in or around the home?: Yes If so, are  there any without handrails?: Yes Home free of loose throw rugs in walkways, pet beds, electrical cords, etc?: Yes Adequate lighting in your home to reduce risk of falls?: Yes Life alert?: No Use of a cane, walker or w/c?: No Grab bars in the bathroom?: No Shower chair or bench in shower?: No Elevated toilet seat or a handicapped toilet?: No  TIMED UP AND GO:  Was the test performed?  Yes  Length of time to ambulate 10 feet: 10 sec Gait steady and fast without use of assistive device    Cognitive Function:        01/07/2024   11:10 AM  6CIT Screen  What Year? 0 points  What month? 0 points  What time? 0 points  Count back from 20 0 points  Months in reverse 0 points  Repeat phrase 0 points  Total Score 0 points    Immunizations Immunization History  Administered Date(s) Administered   Fluad Trivalent(High Dose 65+) 02/15/2023   Fluzone Influenza virus vaccine,trivalent (IIV3), split virus 04/02/2006, 03/21/2009, 05/15/2010   Influenza Split 03/22/2011, 04/16/2012   Influenza Whole 04/02/2006, 03/21/2009, 05/15/2010   Influenza,inj,Quad PF,6+ Mos 09/08/2015, 07/02/2016, 03/14/2020, 04/13/2021, 05/03/2022   Janssen (J&J) SARS-COV-2 Vaccination 08/29/2019   MMR 09/26/2021   PFIZER Comirnaty(Gray Top)Covid-19 Tri-Sucrose Vaccine 05/18/2020   PNEUMOCOCCAL CONJUGATE-20 08/30/2022   PPD Test 03/08/2022   Pfizer Covid-19 Vaccine Bivalent Booster 31yrs & up 04/20/2021   Td 05/30/2006   Tdap 07/02/2016   Zoster Recombinant(Shingrix) 10/16/2016, 09/26/2021    TDAP status: Up to date  Flu Vaccine status: Up to date  Pneumococcal vaccine status: Up to date  Covid-19 vaccine status: Completed vaccines  Qualifies  for Shingles Vaccine? Yes   Zostavax completed No   Shingrix Completed?: Yes  Screening Tests Health Maintenance  Topic Date Due   DEXA SCAN  Never done   COVID-19 Vaccine (4 - 2024-25 season) 02/17/2023   MAMMOGRAM  10/11/2023   INFLUENZA VACCINE   01/17/2024   Medicare Annual Wellness (AWV)  01/06/2025   DTaP/Tdap/Td (3 - Td or Tdap) 07/02/2026   Colonoscopy  03/03/2030   Pneumococcal Vaccine: 50+ Years  Completed   Hepatitis C Screening  Completed   Zoster Vaccines- Shingrix  Completed   Hepatitis B Vaccines  Aged Out   HPV VACCINES  Aged Out   Meningococcal B Vaccine  Aged Out    Health Maintenance  Health Maintenance Due  Topic Date Due   DEXA SCAN  Never done   COVID-19 Vaccine (4 - 2024-25 season) 02/17/2023   MAMMOGRAM  10/11/2023    Colorectal cancer screening: Type of screening: Colonoscopy. Completed 03/03/2020. Repeat every 10 years  Mammogram status: Ordered 01/07/2024. Pt provided with contact info and advised to call to schedule appt.   Bone Density status: Ordered 01/07/2024. Pt provided with contact info and advised to call to schedule appt.  Lung Cancer Screening: (Low Dose CT Chest recommended if Age 50-80 years, 20 pack-year currently smoking OR have quit w/in 15years.) does not qualify.   Lung Cancer Screening Referral: n/a  Additional Screening:  Hepatitis C Screening: does qualify; Completed 01/05/2015  Vision Screening: Recommended annual ophthalmology exams for early detection of glaucoma and other disorders of the eye. Is the patient up to date with their annual eye exam?  Yes  Who is the provider or what is the name of the office in which the patient attends annual eye exams? [facility] If pt is not established with a provider, would they like to be referred to a provider to establish care? N/a.   Dental Screening: Recommended annual dental exams for proper oral hygiene   Community Resource Referral / Chronic Care Management: CRR required this visit?  No   CCM required this visit?  Appt scheduled with PCP     Plan:     I have personally reviewed and noted the following in the patient's chart:   Medical and social history Use of alcohol, tobacco or illicit drugs  Current  medications and supplements including opioid prescriptions. Patient is not currently taking opioid prescriptions. Functional ability and status Nutritional status Physical activity Advanced directives List of other physicians Hospitalizations, surgeries, and ER visits in previous 12 months. None Vitals Screenings to include cognitive, depression, and falls Referrals and appointments  In addition, I have reviewed and discussed with patient certain preventive protocols, quality metrics, and best practice recommendations. A written personalized care plan for preventive services as well as general preventive health recommendations were provided to patient.     Bonny Jon Mayor, [place]   01/07/2024   After Visit Summary: (In Person-Printed) AVS printed and given to the patient  Nurse Notes:    Carolyn [name] is a 68 y.o. female patient of Dorothyann Byars, MD who had a Medicare Annual Wellness Visit today via telephone. She reports that he is socially active and does interact with friends/family regularly. She is minimally physically active. She does enjoy planting flowers.

## 2024-01-07 NOTE — Patient Instructions (Signed)
  Carolyn [name] , Thank you for taking time to come for your Medicare Wellness Visit. I appreciate your ongoing commitment to your health goals. Please review the following plan we discussed and let me know if I can assist you in the future.   These are the goals we discussed:  Goals      Patient Stated     Patient states she would like to lose weight and increase water intake.         This is a list of the screening recommended for you and due dates:  Health Maintenance  Topic Date Due   DEXA scan (bone density measurement)  Never done   COVID-19 Vaccine (4 - 2024-25 season) 02/17/2023   Mammogram  10/11/2023   Flu Shot  01/17/2024   Medicare Annual Wellness Visit  01/06/2025   DTaP/Tdap/Td vaccine (3 - Td or Tdap) 07/02/2026   Colon Cancer Screening  03/03/2030   Pneumococcal Vaccine for age over 16  Completed   Hepatitis C Screening  Completed   Zoster (Shingles) Vaccine  Completed   Hepatitis B Vaccine  Aged Out   HPV Vaccine  Aged Out   Meningitis B Vaccine  Aged Out

## 2024-01-27 ENCOUNTER — Other Ambulatory Visit: Payer: Self-pay | Admitting: Radiology

## 2024-01-31 DIAGNOSIS — H02886 Meibomian gland dysfunction of left eye, unspecified eyelid: Secondary | ICD-10-CM | POA: Diagnosis not present

## 2024-01-31 DIAGNOSIS — H524 Presbyopia: Secondary | ICD-10-CM | POA: Diagnosis not present

## 2024-01-31 DIAGNOSIS — H02883 Meibomian gland dysfunction of right eye, unspecified eyelid: Secondary | ICD-10-CM | POA: Diagnosis not present

## 2024-02-13 ENCOUNTER — Ambulatory Visit: Payer: Medicare (Managed Care) | Admitting: Family Medicine

## 2024-02-18 ENCOUNTER — Encounter: Payer: Self-pay | Admitting: Sports Medicine

## 2024-03-02 ENCOUNTER — Other Ambulatory Visit: Payer: Self-pay | Admitting: Family Medicine

## 2024-03-11 ENCOUNTER — Ambulatory Visit: Payer: Medicare (Managed Care)

## 2024-03-11 ENCOUNTER — Other Ambulatory Visit: Payer: Medicare (Managed Care)

## 2024-03-12 ENCOUNTER — Ambulatory Visit: Payer: Medicare (Managed Care)

## 2024-04-03 ENCOUNTER — Ambulatory Visit: Payer: Medicare (Managed Care)

## 2024-04-03 DIAGNOSIS — Z1231 Encounter for screening mammogram for malignant neoplasm of breast: Secondary | ICD-10-CM | POA: Diagnosis not present

## 2024-04-08 ENCOUNTER — Ambulatory Visit: Payer: Self-pay | Admitting: Family Medicine

## 2024-04-08 NOTE — Progress Notes (Signed)
 Please call patient. Normal mammogram.  Repeat in 1 year.

## 2024-05-29 ENCOUNTER — Telehealth: Payer: Self-pay

## 2024-05-29 NOTE — Progress Notes (Signed)
° °  05/29/2024  Patient ID: Carolyn [name], female   DOB: Oct 11, 1955, 68 y.o.   MRN: [id number]  This patient is appearing on a report for being at risk of failing the adherence measure for hypertension (ACEi/ARB) medications this calendar year.   Medication: losartan  100mg  Last fill date: 05/05/24 for 90 day supply  Insurance report was not up to date. No action needed at this time.   Channing DELENA Mealing, PharmD, DPLA

## 2024-06-08 ENCOUNTER — Other Ambulatory Visit: Payer: Self-pay | Admitting: Family Medicine

## 2024-06-11 ENCOUNTER — Other Ambulatory Visit: Payer: Self-pay | Admitting: Family Medicine

## 2024-06-16 ENCOUNTER — Telehealth: Payer: Self-pay | Admitting: Family Medicine

## 2024-06-16 DIAGNOSIS — E559 Vitamin D deficiency, unspecified: Secondary | ICD-10-CM

## 2024-06-16 DIAGNOSIS — N3281 Overactive bladder: Secondary | ICD-10-CM

## 2024-06-16 DIAGNOSIS — I10 Essential (primary) hypertension: Secondary | ICD-10-CM

## 2024-06-16 NOTE — Telephone Encounter (Signed)
 I'm not sure what labs she is referring to.  Copied from CRM #[id number]. Topic: Clinical - Lab/Test Results >> Jun 09, 2024  2:59 PM Emylou G wrote: Reason for CRM: Patient wanting to schedule 6 mo bloodwork.  Pls order/call her back

## 2024-06-17 NOTE — Telephone Encounter (Signed)
 Called pt and advised her of lab orders

## 2024-06-20 LAB — CBC
Hematocrit: 42.2 % (ref 34.0–46.6)
Hemoglobin: 13.1 g/dL (ref 11.1–15.9)
MCH: 27.1 pg (ref 26.6–33.0)
MCHC: 31 g/dL — ABNORMAL LOW (ref 31.5–35.7)
MCV: 87 fL (ref 79–97)
Platelets: 282 x10E3/uL (ref 150–450)
RBC: 4.84 x10E6/uL (ref 3.77–5.28)
RDW: 13.9 % (ref 11.7–15.4)
WBC: 7.6 x10E3/uL (ref 3.4–10.8)

## 2024-06-20 LAB — BMP8+EGFR
BUN/Creatinine Ratio: 20 (ref 12–28)
BUN: 15 mg/dL (ref 8–27)
CO2: 24 mmol/L (ref 20–29)
Calcium: 9.8 mg/dL (ref 8.7–10.3)
Chloride: 99 mmol/L (ref 96–106)
Creatinine, Ser: 0.76 mg/dL (ref 0.57–1.00)
Glucose: 77 mg/dL (ref 70–99)
Potassium: 4 mmol/L (ref 3.5–5.2)
Sodium: 139 mmol/L (ref 134–144)
eGFR: 86 mL/min/1.73

## 2024-06-20 LAB — VITAMIN D 25 HYDROXY (VIT D DEFICIENCY, FRACTURES): Vit D, 25-Hydroxy: 16.8 ng/mL — ABNORMAL LOW (ref 30.0–100.0)

## 2024-06-20 LAB — FERRITIN: Ferritin: 208 ng/mL — ABNORMAL HIGH (ref 15–150)

## 2024-06-20 LAB — TSH: TSH: 0.666 u[IU]/mL (ref 0.450–4.500)

## 2024-06-23 ENCOUNTER — Ambulatory Visit: Payer: Self-pay | Admitting: Family Medicine

## 2024-06-23 NOTE — Progress Notes (Signed)
 Hi Carolyn [name], blood count is normal no sign of anemia.  Ferritin level looks much better.  Vitamin D  has dropped and is low so please make sure that you are taking 50 mcg daily especially through the winter months and in the summer you can always decrease down to 25 mcg.  Metabolic panel is normal.  Thyroid looks great.

## 2024-06-25 NOTE — Progress Notes (Signed)
Attempted call to patient . Phone rang without answer. Could not leave a voice mail message.

## 2024-07-08 ENCOUNTER — Other Ambulatory Visit: Payer: Self-pay | Admitting: Family Medicine

## 2024-07-08 DIAGNOSIS — F330 Major depressive disorder, recurrent, mild: Secondary | ICD-10-CM

## 2025-01-07 ENCOUNTER — Ambulatory Visit: Payer: Medicare (Managed Care)
